# Patient Record
Sex: Female | Born: 1940 | Race: White | Hispanic: No | Smoking: Former smoker
Health system: Southern US, Community
[De-identification: ages and names within clinical notes are randomized; demographics above are authoritative.]

## PROBLEM LIST (undated history)

## (undated) DIAGNOSIS — E440 Moderate protein-calorie malnutrition: Secondary | ICD-10-CM

## (undated) DIAGNOSIS — J449 Chronic obstructive pulmonary disease, unspecified: Secondary | ICD-10-CM

## (undated) DIAGNOSIS — J181 Lobar pneumonia, unspecified organism: Secondary | ICD-10-CM

## (undated) DIAGNOSIS — D638 Anemia in other chronic diseases classified elsewhere: Secondary | ICD-10-CM

## (undated) DIAGNOSIS — A419 Sepsis, unspecified organism: Secondary | ICD-10-CM

## (undated) DIAGNOSIS — G620 Drug-induced polyneuropathy: Secondary | ICD-10-CM

## (undated) DIAGNOSIS — J189 Pneumonia, unspecified organism: Secondary | ICD-10-CM

## (undated) DIAGNOSIS — C801 Malignant (primary) neoplasm, unspecified: Secondary | ICD-10-CM

## (undated) DIAGNOSIS — D696 Thrombocytopenia, unspecified: Secondary | ICD-10-CM

## (undated) DIAGNOSIS — N200 Calculus of kidney: Secondary | ICD-10-CM

## (undated) DIAGNOSIS — Z5189 Encounter for other specified aftercare: Secondary | ICD-10-CM

## (undated) DIAGNOSIS — I10 Essential (primary) hypertension: Secondary | ICD-10-CM

## (undated) DIAGNOSIS — I714 Abdominal aortic aneurysm, without rupture: Secondary | ICD-10-CM

## (undated) DIAGNOSIS — D849 Immunodeficiency, unspecified: Secondary | ICD-10-CM

## (undated) DIAGNOSIS — Z8619 Personal history of other infectious and parasitic diseases: Secondary | ICD-10-CM

## (undated) DIAGNOSIS — C9200 Acute myeloblastic leukemia, not having achieved remission: Secondary | ICD-10-CM

## (undated) DIAGNOSIS — T451X5A Adverse effect of antineoplastic and immunosuppressive drugs, initial encounter: Secondary | ICD-10-CM

## (undated) DIAGNOSIS — E785 Hyperlipidemia, unspecified: Secondary | ICD-10-CM

## (undated) DIAGNOSIS — D219 Benign neoplasm of connective and other soft tissue, unspecified: Secondary | ICD-10-CM

## (undated) HISTORY — PX: BONE MARROW BIOPSY: SHX199

## (undated) HISTORY — PX: OTHER SURGICAL HISTORY: SHX169

---

## 2014-11-19 ENCOUNTER — Encounter (HOSPITAL_COMMUNITY): Payer: Self-pay | Admitting: *Deleted

## 2014-11-19 ENCOUNTER — Inpatient Hospital Stay (HOSPITAL_COMMUNITY)
Admission: EM | Admit: 2014-11-19 | Discharge: 2014-11-28 | DRG: 871 | Disposition: A | Discharge status: Home Health | Payer: Medicare Other | Attending: Internal Medicine | Admitting: Internal Medicine

## 2014-11-19 ENCOUNTER — Emergency Department (HOSPITAL_COMMUNITY): Payer: Medicare Other

## 2014-11-19 DIAGNOSIS — D630 Anemia in neoplastic disease: Secondary | ICD-10-CM | POA: Diagnosis present

## 2014-11-19 DIAGNOSIS — Z87891 Personal history of nicotine dependence: Secondary | ICD-10-CM | POA: Diagnosis not present

## 2014-11-19 DIAGNOSIS — Z888 Allergy status to other drugs, medicaments and biological substances status: Secondary | ICD-10-CM

## 2014-11-19 DIAGNOSIS — Z9981 Dependence on supplemental oxygen: Secondary | ICD-10-CM

## 2014-11-19 DIAGNOSIS — R109 Unspecified abdominal pain: Secondary | ICD-10-CM | POA: Diagnosis present

## 2014-11-19 DIAGNOSIS — J9811 Atelectasis: Secondary | ICD-10-CM | POA: Diagnosis present

## 2014-11-19 DIAGNOSIS — R6520 Severe sepsis without septic shock: Secondary | ICD-10-CM | POA: Diagnosis present

## 2014-11-19 DIAGNOSIS — E440 Moderate protein-calorie malnutrition: Secondary | ICD-10-CM | POA: Diagnosis present

## 2014-11-19 DIAGNOSIS — G620 Drug-induced polyneuropathy: Secondary | ICD-10-CM | POA: Diagnosis present

## 2014-11-19 DIAGNOSIS — D259 Leiomyoma of uterus, unspecified: Secondary | ICD-10-CM | POA: Diagnosis present

## 2014-11-19 DIAGNOSIS — I714 Abdominal aortic aneurysm, without rupture, unspecified: Secondary | ICD-10-CM | POA: Diagnosis present

## 2014-11-19 DIAGNOSIS — J90 Pleural effusion, not elsewhere classified: Secondary | ICD-10-CM | POA: Diagnosis present

## 2014-11-19 DIAGNOSIS — J449 Chronic obstructive pulmonary disease, unspecified: Secondary | ICD-10-CM | POA: Diagnosis present

## 2014-11-19 DIAGNOSIS — D72829 Elevated white blood cell count, unspecified: Secondary | ICD-10-CM | POA: Diagnosis present

## 2014-11-19 DIAGNOSIS — T451X5A Adverse effect of antineoplastic and immunosuppressive drugs, initial encounter: Secondary | ICD-10-CM | POA: Diagnosis present

## 2014-11-19 DIAGNOSIS — Z5189 Encounter for other specified aftercare: Secondary | ICD-10-CM

## 2014-11-19 DIAGNOSIS — R188 Other ascites: Secondary | ICD-10-CM | POA: Diagnosis present

## 2014-11-19 DIAGNOSIS — J181 Lobar pneumonia, unspecified organism: Secondary | ICD-10-CM | POA: Diagnosis present

## 2014-11-19 DIAGNOSIS — D6481 Anemia due to antineoplastic chemotherapy: Secondary | ICD-10-CM | POA: Diagnosis present

## 2014-11-19 DIAGNOSIS — I712 Thoracic aortic aneurysm, without rupture: Secondary | ICD-10-CM | POA: Diagnosis present

## 2014-11-19 DIAGNOSIS — I10 Essential (primary) hypertension: Secondary | ICD-10-CM | POA: Diagnosis present

## 2014-11-19 DIAGNOSIS — D696 Thrombocytopenia, unspecified: Secondary | ICD-10-CM | POA: Diagnosis present

## 2014-11-19 DIAGNOSIS — Z6821 Body mass index (BMI) 21.0-21.9, adult: Secondary | ICD-10-CM

## 2014-11-19 DIAGNOSIS — Z8619 Personal history of other infectious and parasitic diseases: Secondary | ICD-10-CM

## 2014-11-19 DIAGNOSIS — A419 Sepsis, unspecified organism: Principal | ICD-10-CM | POA: Diagnosis present

## 2014-11-19 DIAGNOSIS — T458X5A Adverse effect of other primarily systemic and hematological agents, initial encounter: Secondary | ICD-10-CM | POA: Diagnosis present

## 2014-11-19 DIAGNOSIS — R197 Diarrhea, unspecified: Secondary | ICD-10-CM | POA: Diagnosis present

## 2014-11-19 DIAGNOSIS — R6521 Severe sepsis with septic shock: Secondary | ICD-10-CM

## 2014-11-19 DIAGNOSIS — N200 Calculus of kidney: Secondary | ICD-10-CM | POA: Diagnosis present

## 2014-11-19 DIAGNOSIS — T501X5A Adverse effect of loop [high-ceiling] diuretics, initial encounter: Secondary | ICD-10-CM | POA: Diagnosis not present

## 2014-11-19 DIAGNOSIS — D638 Anemia in other chronic diseases classified elsewhere: Secondary | ICD-10-CM | POA: Diagnosis present

## 2014-11-19 DIAGNOSIS — C9200 Acute myeloblastic leukemia, not having achieved remission: Secondary | ICD-10-CM | POA: Diagnosis present

## 2014-11-19 DIAGNOSIS — J189 Pneumonia, unspecified organism: Secondary | ICD-10-CM

## 2014-11-19 DIAGNOSIS — E871 Hypo-osmolality and hyponatremia: Secondary | ICD-10-CM | POA: Diagnosis present

## 2014-11-19 DIAGNOSIS — Z7952 Long term (current) use of systemic steroids: Secondary | ICD-10-CM | POA: Diagnosis not present

## 2014-11-19 DIAGNOSIS — R140 Abdominal distension (gaseous): Secondary | ICD-10-CM

## 2014-11-19 DIAGNOSIS — G9340 Encephalopathy, unspecified: Secondary | ICD-10-CM | POA: Diagnosis present

## 2014-11-19 DIAGNOSIS — D6959 Other secondary thrombocytopenia: Secondary | ICD-10-CM | POA: Diagnosis present

## 2014-11-19 DIAGNOSIS — I739 Peripheral vascular disease, unspecified: Secondary | ICD-10-CM | POA: Diagnosis present

## 2014-11-19 DIAGNOSIS — I959 Hypotension, unspecified: Secondary | ICD-10-CM | POA: Diagnosis present

## 2014-11-19 DIAGNOSIS — E876 Hypokalemia: Secondary | ICD-10-CM | POA: Diagnosis not present

## 2014-11-19 DIAGNOSIS — R161 Splenomegaly, not elsewhere classified: Secondary | ICD-10-CM | POA: Diagnosis present

## 2014-11-19 DIAGNOSIS — R112 Nausea with vomiting, unspecified: Secondary | ICD-10-CM | POA: Diagnosis present

## 2014-11-19 DIAGNOSIS — N179 Acute kidney failure, unspecified: Secondary | ICD-10-CM | POA: Diagnosis present

## 2014-11-19 DIAGNOSIS — Z79899 Other long term (current) drug therapy: Secondary | ICD-10-CM | POA: Diagnosis not present

## 2014-11-19 DIAGNOSIS — D849 Immunodeficiency, unspecified: Secondary | ICD-10-CM | POA: Diagnosis present

## 2014-11-19 DIAGNOSIS — N210 Calculus in bladder: Secondary | ICD-10-CM | POA: Diagnosis present

## 2014-11-19 DIAGNOSIS — E785 Hyperlipidemia, unspecified: Secondary | ICD-10-CM | POA: Diagnosis present

## 2014-11-19 DIAGNOSIS — R0602 Shortness of breath: Secondary | ICD-10-CM

## 2014-11-19 HISTORY — DX: Acute myeloblastic leukemia, not having achieved remission: C92.00

## 2014-11-19 HISTORY — DX: Malignant (primary) neoplasm, unspecified: C80.1

## 2014-11-19 HISTORY — DX: Calculus of kidney: N20.0

## 2014-11-19 HISTORY — DX: Hyperlipidemia, unspecified: E78.5

## 2014-11-19 HISTORY — DX: Abdominal aortic aneurysm, without rupture: I71.4

## 2014-11-19 HISTORY — DX: Anemia in other chronic diseases classified elsewhere: D63.8

## 2014-11-19 HISTORY — DX: Encounter for other specified aftercare: Z51.89

## 2014-11-19 HISTORY — DX: Drug-induced polyneuropathy: G62.0

## 2014-11-19 HISTORY — DX: Essential (primary) hypertension: I10

## 2014-11-19 HISTORY — DX: Benign neoplasm of connective and other soft tissue, unspecified: D21.9

## 2014-11-19 HISTORY — DX: Personal history of other infectious and parasitic diseases: Z86.19

## 2014-11-19 HISTORY — DX: Thrombocytopenia, unspecified: D69.6

## 2014-11-19 HISTORY — DX: Lobar pneumonia, unspecified organism: J18.1

## 2014-11-19 HISTORY — DX: Immunodeficiency, unspecified: D84.9

## 2014-11-19 HISTORY — DX: Chronic obstructive pulmonary disease, unspecified: J44.9

## 2014-11-19 HISTORY — DX: Pneumonia, unspecified organism: J18.9

## 2014-11-19 HISTORY — DX: Adverse effect of antineoplastic and immunosuppressive drugs, initial encounter: T45.1X5A

## 2014-11-19 HISTORY — DX: Sepsis, unspecified organism: A41.9

## 2014-11-19 HISTORY — DX: Moderate protein-calorie malnutrition: E44.0

## 2014-11-19 LAB — URINALYSIS, ROUTINE W REFLEX MICROSCOPIC
BILIRUBIN URINE: NEGATIVE
Glucose, UA: NEGATIVE mg/dL
Hgb urine dipstick: NEGATIVE
Ketones, ur: NEGATIVE mg/dL
Leukocytes, UA: NEGATIVE
NITRITE: NEGATIVE
Protein, ur: NEGATIVE mg/dL
SPECIFIC GRAVITY, URINE: 1.013 (ref 1.005–1.030)
UROBILINOGEN UA: 0.2 mg/dL (ref 0.0–1.0)
pH: 7 (ref 5.0–8.0)

## 2014-11-19 LAB — CBC WITH DIFFERENTIAL/PLATELET
BAND NEUTROPHILS: 7 %
BASOS ABS: 0 10*3/uL (ref 0.0–0.1)
BLASTS: 0 %
Basophils Relative: 0 %
EOS ABS: 0 10*3/uL (ref 0.0–0.7)
Eosinophils Relative: 0 %
HEMATOCRIT: 22.2 % — AB (ref 36.0–46.0)
HEMOGLOBIN: 7 g/dL — AB (ref 12.0–15.0)
Lymphocytes Relative: 14 %
Lymphs Abs: 5.7 10*3/uL — ABNORMAL HIGH (ref 0.7–4.0)
MCH: 29.5 pg (ref 26.0–34.0)
MCHC: 31.5 g/dL (ref 30.0–36.0)
MCV: 93.7 fL (ref 78.0–100.0)
METAMYELOCYTES PCT: 1 %
Monocytes Absolute: 1.2 10*3/uL — ABNORMAL HIGH (ref 0.1–1.0)
Monocytes Relative: 3 %
Myelocytes: 1 %
Neutro Abs: 33.8 10*3/uL — ABNORMAL HIGH (ref 1.7–7.7)
Neutrophils Relative %: 74 %
Other: 0 %
PROMYELOCYTES ABS: 0 %
Platelets: 61 10*3/uL — ABNORMAL LOW (ref 150–400)
RBC: 2.37 MIL/uL — ABNORMAL LOW (ref 3.87–5.11)
RDW: 18.8 % — ABNORMAL HIGH (ref 11.5–15.5)
WBC: 40.7 10*3/uL — ABNORMAL HIGH (ref 4.0–10.5)
nRBC: 0 /100 WBC

## 2014-11-19 LAB — COMPREHENSIVE METABOLIC PANEL
ALBUMIN: 2.8 g/dL — AB (ref 3.5–5.0)
ALK PHOS: 49 U/L (ref 38–126)
ALT: 19 U/L (ref 14–54)
ANION GAP: 9 (ref 5–15)
AST: 29 U/L (ref 15–41)
BILIRUBIN TOTAL: 0.8 mg/dL (ref 0.3–1.2)
BUN: 16 mg/dL (ref 6–20)
CALCIUM: 8.1 mg/dL — AB (ref 8.9–10.3)
CO2: 25 mmol/L (ref 22–32)
Chloride: 101 mmol/L (ref 101–111)
Creatinine, Ser: 1.02 mg/dL — ABNORMAL HIGH (ref 0.44–1.00)
GFR calc Af Amer: >60 mL/min (ref >60)
GFR, EST NON AFRICAN AMERICAN: 53 mL/min — AB (ref >60)
GLUCOSE: 120 mg/dL — AB (ref 65–99)
Potassium: 3.9 mmol/L (ref 3.5–5.1)
Sodium: 135 mmol/L (ref 135–145)
TOTAL PROTEIN: 4.9 g/dL — AB (ref 6.5–8.1)

## 2014-11-19 LAB — I-STAT CHEM 8, ED
BUN: 15 mg/dL (ref 6–20)
CHLORIDE: 97 mmol/L — AB (ref 101–111)
Calcium, Ion: 1.1 mmol/L — ABNORMAL LOW (ref 1.13–1.30)
Creatinine, Ser: 1.2 mg/dL — ABNORMAL HIGH (ref 0.44–1.00)
Glucose, Bld: 121 mg/dL — ABNORMAL HIGH (ref 65–99)
HEMATOCRIT: 23 % — AB (ref 36.0–46.0)
Hemoglobin: 7.8 g/dL — ABNORMAL LOW (ref 12.0–15.0)
Potassium: 4.1 mmol/L (ref 3.5–5.1)
SODIUM: 133 mmol/L — AB (ref 135–145)
TCO2: 24 mmol/L (ref 0–100)

## 2014-11-19 LAB — I-STAT CG4 LACTIC ACID, ED
LACTIC ACID, VENOUS: 2.1 mmol/L — AB (ref 0.5–2.0)
Lactic Acid, Venous: 3.34 mmol/L (ref 0.5–2.0)

## 2014-11-19 LAB — ABO/RH: ABO/RH(D): O POS

## 2014-11-19 MED ORDER — PIPERACILLIN-TAZOBACTAM 3.375 G IVPB 30 MIN
3.3750 g | Freq: Once | INTRAVENOUS | Status: AC
  Administered 2014-11-19: 3.375 g via INTRAVENOUS
  Filled 2014-11-19: qty 50

## 2014-11-19 MED ORDER — ACETAMINOPHEN 325 MG PO TABS: 650.0000 mg | ORAL_TABLET | Freq: Once | ORAL | Status: DC

## 2014-11-19 MED ORDER — SODIUM CHLORIDE 0.9 % IV BOLUS (SEPSIS)
1000.0000 mL | Freq: Once | INTRAVENOUS | Status: AC
  Administered 2014-11-19: 1000 mL via INTRAVENOUS

## 2014-11-19 MED ORDER — ONDANSETRON HCL 4 MG PO TABS: 4.0000 mg | ORAL_TABLET | Freq: Four times a day (QID) | ORAL | Status: DC | PRN

## 2014-11-19 MED ORDER — OXYCODONE HCL 5 MG PO TABS
5.0000 mg | ORAL_TABLET | ORAL | Status: DC | PRN
  Administered 2014-11-20 – 2014-11-25 (×14): 5 mg via ORAL
  Filled 2014-11-19 (×15): qty 1

## 2014-11-19 MED ORDER — METOPROLOL TARTRATE 12.5 MG HALF TABLET
12.5000 mg | ORAL_TABLET | Freq: Two times a day (BID) | ORAL | Status: DC
  Administered 2014-11-20 – 2014-11-28 (×14): 12.5 mg via ORAL
  Filled 2014-11-19 (×18): qty 1

## 2014-11-19 MED ORDER — VANCOMYCIN HCL 500 MG IV SOLR: 500.0000 mg | INTRAVENOUS | Status: DC

## 2014-11-19 MED ORDER — VANCOMYCIN 50 MG/ML ORAL SOLUTION
125.0000 mg | Freq: Four times a day (QID) | ORAL | Status: DC
  Administered 2014-11-19 – 2014-11-21 (×6): 125 mg via ORAL
  Filled 2014-11-19 (×11): qty 2.5

## 2014-11-19 MED ORDER — VANCOMYCIN HCL IN DEXTROSE 1-5 GM/200ML-% IV SOLN: 1000.0000 mg | Freq: Once | INTRAVENOUS | Status: DC

## 2014-11-19 MED ORDER — ZOLPIDEM TARTRATE 5 MG PO TABS
5.0000 mg | ORAL_TABLET | Freq: Every day | ORAL | Status: DC
Start: 2014-11-19 — End: 2014-11-23
  Administered 2014-11-19 – 2014-11-21 (×3): 5 mg via ORAL
  Filled 2014-11-19 (×3): qty 1

## 2014-11-19 MED ORDER — VANCOMYCIN HCL IN DEXTROSE 1-5 GM/200ML-% IV SOLN
1000.0000 mg | INTRAVENOUS | Status: AC
  Administered 2014-11-19: 1000 mg via INTRAVENOUS
  Filled 2014-11-19: qty 200

## 2014-11-19 MED ORDER — SODIUM CHLORIDE 0.9 % IV BOLUS (SEPSIS)
2000.0000 mL | Freq: Once | INTRAVENOUS | Status: AC
  Administered 2014-11-19: 2000 mL via INTRAVENOUS

## 2014-11-19 MED ORDER — FENTANYL CITRATE (PF) 100 MCG/2ML IJ SOLN
25.0000 ug | Freq: Once | INTRAMUSCULAR | Status: AC
  Administered 2014-11-19: 25 ug via INTRAVENOUS
  Filled 2014-11-19: qty 2

## 2014-11-19 MED ORDER — DIPHENHYDRAMINE HCL 50 MG PO CAPS: 50.0000 mg | ORAL_CAPSULE | Freq: Every day | ORAL | Status: DC

## 2014-11-19 MED ORDER — PIPERACILLIN-TAZOBACTAM 3.375 G IVPB 30 MIN: 3.3750 g | Freq: Once | INTRAVENOUS | Status: DC

## 2014-11-19 MED ORDER — PIPERACILLIN-TAZOBACTAM 3.375 G IVPB
3.3750 g | Freq: Three times a day (TID) | INTRAVENOUS | Status: DC
  Administered 2014-11-19 – 2014-11-23 (×11): 3.375 g via INTRAVENOUS
  Filled 2014-11-19 (×11): qty 50

## 2014-11-19 MED ORDER — ONDANSETRON HCL 4 MG/2ML IJ SOLN
4.0000 mg | Freq: Once | INTRAMUSCULAR | Status: AC
  Administered 2014-11-19: 4 mg via INTRAVENOUS
  Filled 2014-11-19: qty 2

## 2014-11-19 MED ORDER — FENTANYL CITRATE (PF) 100 MCG/2ML IJ SOLN
50.0000 ug | INTRAMUSCULAR | Status: DC | PRN
  Administered 2014-11-19 – 2014-11-20 (×8): 50 ug via INTRAVENOUS
  Filled 2014-11-19 (×8): qty 2

## 2014-11-19 MED ORDER — ACETAMINOPHEN 325 MG PO TABS
650.0000 mg | ORAL_TABLET | Freq: Four times a day (QID) | ORAL | Status: DC | PRN
  Administered 2014-11-27 – 2014-11-28 (×2): 650 mg via ORAL
  Filled 2014-11-19 (×2): qty 2

## 2014-11-19 MED ORDER — ATORVASTATIN CALCIUM 10 MG PO TABS
10.0000 mg | ORAL_TABLET | Freq: Every day | ORAL | Status: DC
  Administered 2014-11-19 – 2014-11-28 (×10): 10 mg via ORAL
  Filled 2014-11-19 (×10): qty 1

## 2014-11-19 MED ORDER — GABAPENTIN 400 MG PO CAPS
800.0000 mg | ORAL_CAPSULE | Freq: Three times a day (TID) | ORAL | Status: DC
  Administered 2014-11-19 – 2014-11-20 (×3): 800 mg via ORAL
  Filled 2014-11-19 (×3): qty 2

## 2014-11-19 MED ORDER — ONDANSETRON HCL 4 MG/2ML IJ SOLN
4.0000 mg | Freq: Four times a day (QID) | INTRAMUSCULAR | Status: DC | PRN
  Administered 2014-11-20 – 2014-11-25 (×5): 4 mg via INTRAVENOUS
  Filled 2014-11-19 (×5): qty 2

## 2014-11-19 MED ORDER — VANCOMYCIN HCL 500 MG IV SOLR
500.0000 mg | INTRAVENOUS | Status: DC
  Filled 2014-11-19: qty 500

## 2014-11-19 MED ORDER — NOREPINEPHRINE BITARTRATE 1 MG/ML IV SOLN: 2.0000 ug/min | INTRAVENOUS | Status: DC

## 2014-11-19 MED ORDER — SODIUM CHLORIDE 0.9 % IV SOLN
INTRAVENOUS | Status: AC
  Administered 2014-11-19: 23:00:00 via INTRAVENOUS

## 2014-11-19 MED ORDER — ACETAMINOPHEN 650 MG RE SUPP: 650.0000 mg | Freq: Four times a day (QID) | RECTAL | Status: DC | PRN

## 2014-11-19 MED ORDER — NOREPINEPHRINE BITARTRATE 1 MG/ML IV SOLN
0.0000 ug/min | INTRAVENOUS | Status: DC
  Administered 2014-11-19: 4 ug/min via INTRAVENOUS
  Filled 2014-11-19: qty 4

## 2014-11-19 NOTE — ED Notes (Signed)
Pt reports she is not in pain as long as she does not move or take a deep breath.

## 2014-11-19 NOTE — Progress Notes (Signed)
ANTIBIOTIC CONSULT NOTE - INITIAL  Pharmacy Consult for Vancomycin Indication: rule out sepsis  Allergies  Allergen Reactions  . Other     chlorhydrate     Patient Measurements: Height: 5' (152.4 cm) Weight: 106 lb (48.081 kg) IBW/kg (Calculated) : 45.5  Vital Signs: Temp: 100.2 F (37.9 C) (10/09 1500) Temp Source: Oral (10/09 1500) BP: 87/36 mmHg (10/09 1500) Pulse Rate: 78 (10/09 1500) Intake/Output from previous day:   Intake/Output from this shift: Total I/O In: 2050 [IV Piggyback:2050] Out: -   Labs:  Recent Labs  11/19/14 1459  HGB 7.8*  CREATININE 1.20*   Estimated Creatinine Clearance: 30 mL/min (by C-G formula based on Cr of 1.2). No results for input(s): VANCOTROUGH, VANCOPEAK, VANCORANDOM, GENTTROUGH, GENTPEAK, GENTRANDOM, TOBRATROUGH, TOBRAPEAK, TOBRARND, AMIKACINPEAK, AMIKACINTROU, AMIKACIN in the last 72 hours.   Microbiology: No results found for this or any previous visit (from the past 720 hour(s)).  Medical History: Past Medical History  Diagnosis Date  . Cancer ([place])   . Blood transfusion without reported diagnosis   . COPD (chronic obstructive pulmonary disease) ([place])   . Hypertension   . Immune deficiency disorder [facility])    Assessment: 48 yoF on chemotherapy was brought to ED on 10/9 with suspected sepsis and fever.  Zosyn x1 per MD, and Pharmacy is consulted to dose vancomycin.  10/9 >> Zosyn x1 10/9 >> Vanc >>   Today, 11/19/2014:  Lactic acid 3.34  Tm 101.9  WBC 40.7  SCr 1.2 with CrCl ~ 30 ml/min   Goal of Therapy:  Vancomycin trough level 15-20 mcg/ml  Plan:   Vancomycin 1g IV stat, then 500mg  IV q24h.  Measure Vanc trough at steady state.  Follow up renal fxn, culture results, and clinical course.  Follow up Zosyn dosing.  Gretta Arab PharmD, BCPS Pager [phone] 11/19/2014 3:21 PM

## 2014-11-19 NOTE — ED Notes (Signed)
Fever

## 2014-11-19 NOTE — ED Notes (Signed)
Patient transported to CT 

## 2014-11-19 NOTE — H&P (Addendum)
[facility] History and Physical  Allison Burgess BWI:[id number] DOB: Jan 30, 1941 DOA: 11/19/2014  Referring physician: Dr. Kendell Bane. PCP: No primary care provider on file. patient's primary care in [facility]. Specialists: [employer].  Chief Complaint: Nausea vomiting diarrhea and confusion.  HPI: Allison Burgess is a 74 y.o. 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 As per the patient she had multiple episodes of nausea and vomiting and diarrhea last night. Patient was found to be confused today and was brought to the ER. Patient's family also stated patient was febrile in her house with temperatures running around 101F. Patient also has been having left flank pain and on exam patient had mild tenderness of the flank. Patient was recently admitted and discharged at [facility] 2 weeks ago for C. difficile colitis. Patient had just finished a course of Flagyl week ago. Patient had chemotherapy last week followed by Neulasta injection. In the ER patient was found to be initially hypotensive and was given fluid boluses and briefly was placed on Levophed. After patient's blood pressure improved patient was weaned off Levophed. Presently patient's blood pressure is in the 638 systolic and heart rate is improved. Patient still has mild left flank pain. CT abdomen and pelvis done for renal stone study shows a renal stone nonobstructing and also a stone in the bladder. Otherwise nothing acute except for patient's known splenomegaly. Chest x-ray shows possible atelectasis versus infiltrates. Patient states she has been having cough for last 2 weeks since he stopped smoking. Patient otherwise denies any shortness of breath headache visual symptoms any weakness of the upper or lower extremities. Patient has been admitted for severe sepsis possibly from recurrence of C.  difficile colitis and possible pneumonia.   Review of Systems: As presented in the history of presenting illness, rest negative.  Past Medical History  Diagnosis Date  . Cancer ([place])   . Blood transfusion without reported diagnosis   . COPD (chronic obstructive pulmonary disease) ([place])   . Hypertension   . Immune deficiency disorder [facility])    Past Surgical History  Procedure Laterality Date  . Portacath    . Bone marrow biopsy    . Pad stent     Social History:  reports that she quit smoking about 2 weeks ago. She does not have any smokeless tobacco history on file. She reports that she does not drink alcohol or use illicit drugs. Where does patient live home. Can patient participate in ADLs? Yes.  Allergies  Allergen Reactions  . Other     chlorhydrate - makes her hyper      Family History:  Family History  Problem Relation Age of Onset  . Leukemia Neg Hx   . CAD Neg Hx   . Diabetes Mellitus II Neg Hx       Prior to Admission medications   Medication Sig Start Date End Date Taking? Authorizing Provider  amLODipine (NORVASC) 10 MG tablet Take 10 mg by mouth daily.   Yes Historical Provider, MD  atorvastatin (LIPITOR) 10 MG tablet Take 10 mg by mouth daily.   Yes Historical Provider, MD  dexamethasone (DECADRON) 4 MG tablet Take 4 mg by mouth daily. For 1 week following chemo therapy   Yes Historical Provider, MD  diphenhydrAMINE (BENADRYL) 25 mg capsule Take 50 mg by mouth at bedtime.   Yes Historical Provider, MD  gabapentin (NEURONTIN)  800 MG tablet Take 800 mg by mouth 3 (three) times daily.   Yes Historical Provider, MD  hydrocortisone cream 0.5 % Apply 1 application topically 2 (two) times daily as needed (for bruising).   Yes Historical Provider, MD  levofloxacin (LEVAQUIN) 500 MG tablet Take 500 mg by mouth daily. For 10 days 10/31/14  Yes Historical Provider, MD  metoprolol tartrate (LOPRESSOR) 25 MG tablet Take 12.5 mg by mouth 2 (two) times daily.   Yes  Historical Provider, MD  metroNIDAZOLE (FLAGYL) 500 MG tablet Take 500 mg by mouth 3 (three) times daily. For 14 days 11/06/14  Yes Historical Provider, MD  oxycodone (OXY-IR) 5 MG capsule Take 5 mg by mouth every 4 (four) hours as needed for pain.   Yes Historical Provider, MD  OXYGEN Inhale 2 L into the lungs at bedtime.   Yes Historical Provider, MD  PRESCRIPTION MEDICATION [facility] [phone] Dr. Marval Regal Historical Provider, MD    Physical Exam: Filed Vitals:   11/19/14 2000 11/19/14 2015 11/19/14 2021 11/19/14 2113  BP: 113/45 121/46 120/60 120/55  Pulse: 89 89    Temp:      TempSrc:      Resp: 21 23    Height:      Weight:      SpO2: 97% 95%       General:  Moderately built and nourished.  Eyes: Anicteric mild pallor.  ENT: No discharge from the ears eyes nose and mouth.  Neck: No mass felt. No neck rigidity.  Cardiovascular: S1 and S2 heard.  Respiratory: No rhonchi or crepitations.  Abdomen: Left upper quadrant tenderness.  Skin: No rash.  Musculoskeletal: No edema.  Psychiatric: Appears normal.  Neurologic: Alert awake oriented to time place and person. Moves all extremities.  Labs on Admission:  Basic Metabolic Panel:  Recent Labs Lab 11/19/14 1425 11/19/14 1459  NA 135 133*  K 3.9 4.1  CL 101 97*  CO2 25  --   GLUCOSE 120* 121*  BUN 16 15  CREATININE 1.02* 1.20*  CALCIUM 8.1*  --    Liver Function Tests:  Recent Labs Lab 11/19/14 1425  AST 29  ALT 19  ALKPHOS 49  BILITOT 0.8  PROT 4.9*  ALBUMIN 2.8*   No results for input(s): LIPASE, AMYLASE in the last 168 hours. No results for input(s): AMMONIA in the last 168 hours. CBC:  Recent Labs Lab 11/19/14 1425 11/19/14 1459  WBC 40.7*  --   NEUTROABS 33.8*  --   HGB 7.0* 7.8*  HCT 22.2* 23.0*  MCV 93.7  --   PLT 61*  --    Cardiac Enzymes: No results for input(s): CKTOTAL, CKMB, CKMBINDEX, TROPONINI in the last 168  hours.  BNP (last 3 results) No results for input(s): BNP in the last 8760 hours.  ProBNP (last 3 results) No results for input(s): PROBNP in the last 8760 hours.  CBG: No results for input(s): GLUCAP in the last 168 hours.  Radiological Exams on Admission: Dg Chest Port 1 View  11/19/2014   CLINICAL DATA:  Cough and chest congestion over the last few days.  EXAM: PORTABLE CHEST 1 VIEW  COMPARISON:  None.  FINDINGS: Power port inserted from a right internal jugular approach has its tip at the SVC RA junction. The patient is rotated towards the left. Heart size is normal. The aorta shows calcification. Right lung is clear. The left lung is clear except for mild  atelectasis or infiltrate at the base. No dense consolidation. No lobar collapse. No effusion.  IMPRESSION: Rotated towards the left. Mild patchy density at the left base that could represent atelectasis or mild left base pneumonia.   Electronically Signed   By: Nelson Chimes M.D.   On: 11/19/2014 15:43   Ct Renal Stone Study  11/19/2014   CLINICAL DATA:  74 year old who is currently undergoing chemotherapy for leukemia, presenting to the emergency department with fever, generalized abdominal pain and hypotension, possible sepsis.  EXAM: CT ABDOMEN AND PELVIS WITHOUT CONTRAST  TECHNIQUE: Multidetector CT imaging of the abdomen and pelvis was performed following the standard protocol without IV contrast.  COMPARISON:  None.  FINDINGS: Respiratory motion blurred many of the images of the upper and mid abdomen.  Hepatobiliary: Liver normal in size and appearance. No calcified gallstones. No gallbladder wall thickening or pericholecystic inflammation. No biliary ductal dilation.  Spleen: Enlarged measuring approximately 12.0 x 7.3 x 13.4 cm yielding a volume of approximately 600 ml. No focal splenic parenchymal abnormality allowing for the unenhanced technique.  Pancreas:  Normal unenhanced appearance.  Adrenal glands:  Normal in appearance.   Genitourinary: Approximate 1.5 cm nonobstructing calculus in a lower pole calix of the right kidney. Very small (2-3 mm) calculi in lower pole calices of the left kidney. No evidence of hydronephrosis involving either kidney. Within the limits of the unenhanced technique, no focal parenchymal abnormality involving either kidney other than an exophytic simple cyst arising from the lower pole of the left kidney. Approximate 8 mm calculus in the dependent portion of the urinary bladder. Distended urinary bladder.  Multiple uterine fibroids, including a calcified, degenerated fibroid arising from the fundus approximate 3.1 x 3.3 cm cystic structure low in the left side of the pelvis which appears to arise from the lower uterine segment.  Gastrointestinal: Stomach normal in appearance for degree of distention. Normal-appearing small bowel. Moderate colonic stool burden. No evidence of diverticulosis. Normal appendix in the right upper pelvis.  Ascites: Small amount of ascites in the perihepatic and perisplenic regions. No significant ascites elsewhere.  Vascular: Severe aortoiliofemoral atherosclerosis. 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 Bilobed infrarenal abdominal aortic aneurysm with the upper and lower portions each having a maximum diameter of 3.0 cm. Bilateral common iliac artery stents.  Lymphatic:  No pathologic lymphadenopathy in the abdomen or pelvis.  Other findings: None.  Musculoskeletal: Thoracolumbar scoliosis convex left. Severe symmetric joint space narrowing involving both hips. Severe facet degenerative changes at L4-5 and L5-S1, left much greater than right. Osseous demineralization.  Visualized lower thorax: Heart enlarged with left ventricular predominance. Apparent right coronary artery stenting. Scarring in the lingula and left lower lobe.  IMPRESSION: 1. Small amount of ascites in the upper abdomen. 2. No acute abnormalities otherwise involving the  abdomen or pelvis. 3. 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 Bilobed infrarenal abdominal aortic aneurysm with maximum diameter 3.0 cm. 4. Nonobstructing bilateral renal calculi including a large (1.5 cm) calculus in the lower pole of the right kidney. 5. Approximate 8 mm calculus in the dependent portion of the distended urinary bladder. 6. Splenomegaly without evidence of focal splenic parenchymal abnormality. 7. Multiple uterine fibroids. What I believe is a degenerated, liquified approximate 3 cm fibroid arises from the left posterior lower uterine segment.   Electronically Signed   By: Evangeline Dakin M.D.   On: 11/19/2014 17:25    Assessment/Plan Principal Problem:   Sepsis ([place]) Active Problems:  Nausea vomiting and diarrhea   Acute encephalopathy   Essential hypertension   AML (acute myelogenous leukemia) ([facility])   PAD (peripheral artery disease) ([facility])   Hyperlipidemia   Left flank pain   1. Severe sepsis - suspect recurrence of C. difficile colitis as a primary cause but since chest x-ray showing possible infiltrate and patient also states she has been having some productive cough we will continue with vancomycin and Zosyn and by mouth vancomycin per pharmacy for C. difficile coverage. Follow blood cultures urine cultures and C. difficile studies. Continue with hydration and hold antihypertensives. Check lactic acid levels and procalcitonin. Patient is on sepsis protocol. As discussed in the history of present illness patient was briefly on liver for which has been discontinued now. 2. Left flank pain - patient still has mild tenderness. Patient's CT renal survey shows splenomegaly and also stone in the bladder. Otherwise nothing acute. If abdominal pain still persists may need CT abdomen with contrast. 3. Acute encephalopathy - probably from sepsis. Presently essentially resolved. Closely observe. Appears nonfocal. 4. Acute renal failure - probably from  dehydration continue hydration and closely following the verbal metabolic panel. 5. Anemia and thrombocytopenia with leukocytosis - probably related to patient's known history of AML and recent Neulasta injection. Leukocytosis also could be from patient's sepsis. Follow CBC. Type and screen. 6. Hypertension - hold antihypertensives for now. 7. Hyperlipidemia - on statins. 8. History of peripheral artery disease status post stenting. 9. 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 Patient will need close follow-up as outpatient. If Abdominal pain persists then CT contrast as discussed #2 should be done.  I have reviewed patient's old charts are labs in care everywhere. Personally reviewed chest x-ray.   DVT Prophylaxis SCDs. Patient has significant thrombocytopenia.  Code Status: Full code.  Family Communication: Patient's husband and son.  Disposition Plan: Admit to inpatient.    Rilya Longo N. [facility] Pager [phone].  If 7PM-7AM, please contact night-coverage [email or web address] Password [email or web address] 11/19/2014, 11:20 PM

## 2014-11-19 NOTE — ED Notes (Signed)
Bed: YO37 Expected date: 11/19/14 Expected time: 1:50 PM Means of arrival: Ambulance Comments: ? Sepsis, fever chemo pt

## 2014-11-19 NOTE — ED Notes (Signed)
She is back from CT and remains in no distress.

## 2014-11-19 NOTE — ED Provider Notes (Signed)
CSN: [id number]     Arrival date & time 11/19/14  1400 History   First MD Initiated Contact with Patient 11/19/14 1416     Chief Complaint  Patient presents with  . Code Sepsis     (Consider location/radiation/quality/duration/timing/severity/associated sxs/prior Treatment) Patient is a 74 y.o. female presenting with general illness. The history is provided by the patient and a relative.  Illness Severity:  Severe Onset quality:  Gradual Duration:  1 day Timing:  Constant Progression:  Worsening Chronicity:  Recurrent Associated symptoms: abdominal pain, congestion, cough and fever   Associated symptoms: no chest pain, no headaches, no myalgias, no nausea, no rhinorrhea, no shortness of breath, no vomiting and no wheezing     74 yo F with a chief complaint of abdominal pain. Patient has had vomiting and diarrhea since yesterday. Patient is on chemotherapy for AML. Patient is typically seen at [facility] for this care. Family had moved here about a year ago and the patient is closer to this hospital. Was taken here when she was having significant pain as well as more tiredness for them at home. Family feels like she has been neutropenic before. Last had an injection on Friday to increase their white blood cells. Was recently hospitalized by regional for hospital-acquired pneumonia. While there patient stay was complicated with a Clostridium difficile infection.  Past Medical History  Diagnosis Date  . Cancer ([place])   . Blood transfusion without reported diagnosis   . COPD (chronic obstructive pulmonary disease) ([place])   . Hypertension   . Immune deficiency disorder [facility])    Past Surgical History  Procedure Laterality Date  . Portacath    . Bone marrow biopsy    . Pad stent     Family History  Problem Relation Age of Onset  . Leukemia Neg Hx   . CAD Neg Hx   . Diabetes Mellitus II Neg Hx    Social History  Substance Use Topics  . Smoking status: Former Smoker     Quit date: 11/05/2014  . Smokeless tobacco: None  . Alcohol Use: No   OB History    No data available     Review of Systems  Constitutional: Positive for fever. Negative for chills.  HENT: Positive for congestion. Negative for rhinorrhea.   Eyes: Negative for redness and visual disturbance.  Respiratory: Positive for cough. Negative for shortness of breath and wheezing.   Cardiovascular: Negative for chest pain and palpitations.  Gastrointestinal: Positive for abdominal pain. Negative for nausea and vomiting.  Genitourinary: Negative for dysuria and urgency.  Musculoskeletal: Negative for myalgias and arthralgias.  Skin: Negative for pallor and wound.  Neurological: Negative for dizziness and headaches.      Allergies  Other  Home Medications   Prior to Admission medications   Medication Sig Start Date End Date Taking? Authorizing Provider  amLODipine (NORVASC) 10 MG tablet Take 10 mg by mouth daily.   Yes Historical Provider, MD  atorvastatin (LIPITOR) 10 MG tablet Take 10 mg by mouth daily.   Yes Historical Provider, MD  dexamethasone (DECADRON) 4 MG tablet Take 4 mg by mouth daily. For 1 week following chemo therapy   Yes Historical Provider, MD  diphenhydrAMINE (BENADRYL) 25 mg capsule Take 50 mg by mouth at bedtime.   Yes Historical Provider, MD  gabapentin (NEURONTIN) 800 MG tablet Take 800 mg by mouth 3 (three) times daily.   Yes Historical Provider, MD  hydrocortisone cream 0.5 % Apply 1 application topically 2 (  two) times daily as needed (for bruising).   Yes Historical Provider, MD  levofloxacin (LEVAQUIN) 500 MG tablet Take 500 mg by mouth daily. For 10 days 10/31/14  Yes Historical Provider, MD  metoprolol tartrate (LOPRESSOR) 25 MG tablet Take 12.5 mg by mouth 2 (two) times daily.   Yes Historical Provider, MD  metroNIDAZOLE (FLAGYL) 500 MG tablet Take 500 mg by mouth 3 (three) times daily. For 14 days 11/06/14  Yes Historical Provider, MD  oxycodone (OXY-IR) 5 MG  capsule Take 5 mg by mouth every 4 (four) hours as needed for pain.   Yes Historical Provider, MD  OXYGEN Inhale 2 L into the lungs at bedtime.   Yes Historical Provider, MD  PRESCRIPTION MEDICATION Chemo - [facility] [phone] Dr. Baird Cancer   Yes Historical Provider, MD   BP 119/44 mmHg  Pulse 83  Temp(Src) 98.6 F (37 C) (Oral)  Resp 24  Ht 5' (1.524 m)  Wt 116 lb 6.5 oz (52.8 kg)  BMI 22.73 kg/m2  SpO2 96% Physical Exam  Constitutional: She is oriented to person, place, and time. She appears well-developed and well-nourished. No distress.  HENT:  Head: Normocephalic and atraumatic.  Eyes: EOM are normal. Pupils are equal, round, and reactive to light.  Neck: Normal range of motion. Neck supple.  Cardiovascular: Normal rate and regular rhythm.  Exam reveals no gallop and no friction rub.   No murmur heard. Pulmonary/Chest: Effort normal. She has no wheezes. She has no rales.  Abdominal: Soft. She exhibits no distension. There is tenderness (left cva worst). There is no rebound and no guarding.  Musculoskeletal: She exhibits no edema or tenderness.  Neurological: She is alert and oriented to person, place, and time.  Skin: Skin is warm and dry. She is not diaphoretic.  Psychiatric: She has a normal mood and affect. Her behavior is normal.    ED Course  Procedures (including critical care time) Labs Review Labs Reviewed  COMPREHENSIVE METABOLIC PANEL - Abnormal; Notable for the following:    Glucose, Bld 120 (*)    Creatinine, Ser 1.02 (*)    Calcium 8.1 (*)    Total Protein 4.9 (*)    Albumin 2.8 (*)    GFR calc non Af Amer 53 (*)    All other components within normal limits  CBC WITH DIFFERENTIAL/PLATELET - Abnormal; Notable for the following:    WBC 40.7 (*)    RBC 2.37 (*)    Hemoglobin 7.0 (*)    HCT 22.2 (*)    RDW 18.8 (*)    Platelets 61 (*)    Neutro Abs 33.8 (*)    Lymphs Abs 5.7 (*)    Monocytes Absolute 1.2 (*)     All other components within normal limits  URINALYSIS, ROUTINE W REFLEX MICROSCOPIC (NOT AT [facility]) - Abnormal; Notable for the following:    APPearance CLOUDY (*)    All other components within normal limits  CBC WITH DIFFERENTIAL/PLATELET - Abnormal; Notable for the following:    WBC 33.4 (*)    RBC 2.56 (*)    Hemoglobin 7.4 (*)    HCT 24.4 (*)    RDW 19.1 (*)    Platelets 48 (*)    Neutro Abs 28.7 (*)    Monocytes Absolute 2.0 (*)    All other components within normal limits  PROTIME-INR - Abnormal; Notable for the following:    Prothrombin Time 20.1 (*)    INR 1.71 (*)  All other components within normal limits  APTT - Abnormal; Notable for the following:    aPTT 39 (*)    All other components within normal limits  COMPREHENSIVE METABOLIC PANEL - Abnormal; Notable for the following:    Glucose, Bld 103 (*)    Calcium 7.3 (*)    Total Protein 4.9 (*)    Albumin 2.8 (*)    All other components within normal limits  I-STAT CG4 LACTIC ACID, ED - Abnormal; Notable for the following:    Lactic Acid, Venous 3.34 (*)    All other components within normal limits  I-STAT CHEM 8, ED - Abnormal; Notable for the following:    Sodium 133 (*)    Chloride 97 (*)    Creatinine, Ser 1.20 (*)    Glucose, Bld 121 (*)    Calcium, Ion 1.10 (*)    Hemoglobin 7.8 (*)    HCT 23.0 (*)    All other components within normal limits  I-STAT CG4 LACTIC ACID, ED - Abnormal; Notable for the following:    Lactic Acid, Venous 2.10 (*)    All other components within normal limits  CULTURE, BLOOD (ROUTINE X 2)  CULTURE, BLOOD (ROUTINE X 2)  URINE CULTURE  MRSA PCR SCREENING  C DIFFICILE QUICK SCREEN W PCR REFLEX  LACTIC ACID, PLASMA  LACTIC ACID, PLASMA  PROCALCITONIN  GI PATHOGEN PANEL BY PCR, STOOL  TYPE AND SCREEN  ABO/RH    Imaging Review Dg Chest Port 1 View  11/19/2014   CLINICAL DATA:  Cough and chest congestion over the last few days.  EXAM: PORTABLE CHEST 1 VIEW  COMPARISON:   None.  FINDINGS: Power port inserted from a right internal jugular approach has its tip at the SVC RA junction. The patient is rotated towards the left. Heart size is normal. The aorta shows calcification. Right lung is clear. The left lung is clear except for mild atelectasis or infiltrate at the base. No dense consolidation. No lobar collapse. No effusion.  IMPRESSION: Rotated towards the left. Mild patchy density at the left base that could represent atelectasis or mild left base pneumonia.   Electronically Signed   By: Nelson Chimes M.D.   On: 11/19/2014 15:43   Ct Renal Stone Study  11/19/2014   CLINICAL DATA:  74 year old who is currently undergoing chemotherapy for leukemia, presenting to the emergency department with fever, generalized abdominal pain and hypotension, possible sepsis.  EXAM: CT ABDOMEN AND PELVIS WITHOUT CONTRAST  TECHNIQUE: Multidetector CT imaging of the abdomen and pelvis was performed following the standard protocol without IV contrast.  COMPARISON:  None.  FINDINGS: Respiratory motion blurred many of the images of the upper and mid abdomen.  Hepatobiliary: Liver normal in size and appearance. No calcified gallstones. No gallbladder wall thickening or pericholecystic inflammation. No biliary ductal dilation.  Spleen: Enlarged measuring approximately 12.0 x 7.3 x 13.4 cm yielding a volume of approximately 600 ml. No focal splenic parenchymal abnormality allowing for the unenhanced technique.  Pancreas:  Normal unenhanced appearance.  Adrenal glands:  Normal in appearance.  Genitourinary: Approximate 1.5 cm nonobstructing calculus in a lower pole calix of the right kidney. Very small (2-3 mm) calculi in lower pole calices of the left kidney. No evidence of hydronephrosis involving either kidney. Within the limits of the unenhanced technique, no focal parenchymal abnormality involving either kidney other than an exophytic simple cyst arising from the lower pole of the left kidney.  Approximate 8 mm calculus in the dependent portion  of the urinary bladder. Distended urinary bladder.  Multiple uterine fibroids, including a calcified, degenerated fibroid arising from the fundus approximate 3.1 x 3.3 cm cystic structure low in the left side of the pelvis which appears to arise from the lower uterine segment.  Gastrointestinal: Stomach normal in appearance for degree of distention. Normal-appearing small bowel. Moderate colonic stool burden. No evidence of diverticulosis. Normal appendix in the right upper pelvis.  Ascites: Small amount of ascites in the perihepatic and perisplenic regions. No significant ascites elsewhere.  Vascular: Severe aortoiliofemoral atherosclerosis. 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 Bilobed infrarenal abdominal aortic aneurysm with the upper and lower portions each having a maximum diameter of 3.0 cm. Bilateral common iliac artery stents.  Lymphatic:  No pathologic lymphadenopathy in the abdomen or pelvis.  Other findings: None.  Musculoskeletal: Thoracolumbar scoliosis convex left. Severe symmetric joint space narrowing involving both hips. Severe facet degenerative changes at L4-5 and L5-S1, left much greater than right. Osseous demineralization.  Visualized lower thorax: Heart enlarged with left ventricular predominance. Apparent right coronary artery stenting. Scarring in the lingula and left lower lobe.  IMPRESSION: 1. Small amount of ascites in the upper abdomen. 2. No acute abnormalities otherwise involving the abdomen or pelvis. 3. 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 Bilobed infrarenal abdominal aortic aneurysm with maximum diameter 3.0 cm. 4. Nonobstructing bilateral renal calculi including a large (1.5 cm) calculus in the lower pole of the right kidney. 5. Approximate 8 mm calculus in the dependent portion of the distended urinary bladder. 6. Splenomegaly without evidence of focal  splenic parenchymal abnormality. 7. Multiple uterine fibroids. What I believe is a degenerated, liquified approximate 3 cm fibroid arises from the left posterior lower uterine segment.   Electronically Signed   By: Evangeline Dakin M.D.   On: 11/19/2014 17:25   I have personally reviewed and evaluated these images and lab results as part of my medical decision-making.   EKG Interpretation   Date/Time:  Sunday November 19 2014 14:14:54 EDT Ventricular Rate:  83 PR Interval:  107 QRS Duration: 88 QT Interval:  355 QTC Calculation: 417 R Axis:   -9 Text Interpretation:  Sinus rhythm Short PR interval Nonspecific T  abnormalities, lateral leads flipped t wave in lead aVL No old tracing to  compare Confirmed by Savon Bordonaro MD, DANIEL [phone]) on 11/19/2014 2:37:23 PM        EMERGENCY DEPARTMENT [place] CARDIAC EXAM "Study: Limited Ultrasound of the heart and pericardium"  INDICATIONS:Hypotension Multiple views of the heart and pericardium are obtained with a multi-frequency probe.  PERFORMED UJ:[id number]  IMAGES ARCHIVED?: Yes  FINDINGS: No pericardial effusion, Decreased contractility and IVC dilated  LIMITATIONS:  Emergent procedure  VIEWS USED: Subcostal 4 chamber, Apical 4 chamber  and Inferior Vena Cava  INTERPRETATION: Cardiac activity present, Pericardial effusioin absent, Cardiac tamponade absent, Probable elevated CVP and Decreased contractility   COMMENT:  Dilated IVC, need to start pressors  MDM   Final diagnoses:  Left flank pain  Sepsis, due to unspecified organism [facility])  Septic shock ([place])    74 yo F with a chief complaint of fever. Patient has a history of neutropenia. Febrile to 1.9 orally here. Blood pressure is low 90 over 30s with worst in the 78G systolic. Patient was given 2 L IV fluids. On metoprolol may blunt tachycardia.  Code sepsis initiated.  Bedside ultrasound with very minimal respiratory variation in the IVC. Started on [place].  Initial lactate 3.3. Hemoglobin  7.8.  Will obtain CT abd with left flank pain and recent hx of cdiff. Patient care turned over to Dr. Billy Fischer  CRITICAL CARE Performed by: Cecilio Asper   Total critical care time: 45 min  Critical care time was exclusive of separately billable procedures and treating other patients.  Critical care was necessary to treat or prevent imminent or life-threatening deterioration.  Critical care was time spent personally by me on the following activities: development of treatment plan with patient and/or surrogate as well as nursing, discussions with consultants, evaluation of patient's response to treatment, examination of patient, obtaining history from patient or surrogate, ordering and performing treatments and interventions, ordering and review of laboratory studies, ordering and review of radiographic studies, pulse oximetry and re-evaluation of patient's condition.  The patients results and plan were reviewed and discussed.   Any x-rays performed were independently reviewed by myself.   Differential diagnosis were considered with the presenting HPI.  Medications  vancomycin (VANCOCIN) 50 mg/mL oral solution 125 mg (125 mg Oral Given 11/20/14 1146)  fentaNYL (SUBLIMAZE) injection 50 mcg (50 mcg Intravenous Given 11/20/14 1146)  atorvastatin (LIPITOR) tablet 10 mg (10 mg Oral Given 11/20/14 0931)  gabapentin (NEURONTIN) capsule 800 mg (800 mg Oral Given 11/20/14 0931)  metoprolol tartrate (LOPRESSOR) tablet 12.5 mg (12.5 mg Oral Given 11/20/14 0931)  oxyCODONE (Oxy IR/ROXICODONE) immediate release tablet 5 mg (5 mg Oral Given 11/20/14 1340)  acetaminophen (TYLENOL) tablet 650 mg (not administered)    Or  acetaminophen (TYLENOL) suppository 650 mg (not administered)  ondansetron (ZOFRAN) tablet 4 mg ( Oral See Alternative 11/20/14 1007)    Or  ondansetron (ZOFRAN) injection 4 mg (4 mg Intravenous Given 11/20/14 1007)  0.9 %  sodium chloride infusion ( Intravenous Rate/Dose  Change 11/20/14 1431)  zolpidem (AMBIEN) tablet 5 mg (5 mg Oral Given 11/19/14 2349)  piperacillin-tazobactam (ZOSYN) IVPB 3.375 g (3.375 g Intravenous Given 11/20/14 0744)  antiseptic oral rinse (CPC / CETYLPYRIDINIUM CHLORIDE 0.05%) solution 7 mL (7 mLs Mouth Rinse Given 11/20/14 0932)  vancomycin (VANCOCIN) 500 mg in sodium chloride 0.9 % 100 mL IVPB (500 mg Intravenous Given 11/20/14 0802)  potassium chloride SA (K-DUR,KLOR-CON) CR tablet 40 mEq (not administered)  sodium chloride 0.9 % bolus 2,000 mL (0 mLs Intravenous Stopped 11/19/14 1559)  piperacillin-tazobactam (ZOSYN) IVPB 3.375 g (0 g Intravenous Stopped 11/19/14 1559)  vancomycin (VANCOCIN) IVPB 1000 mg/200 mL premix (0 mg Intravenous Stopped 11/19/14 1702)  sodium chloride 0.9 % bolus 1,000 mL (0 mLs Intravenous Stopped 11/19/14 1845)  sodium chloride 0.9 % bolus 1,000 mL (0 mLs Intravenous Stopped 11/19/14 2115)  sodium chloride 0.9 % bolus 1,000 mL (0 mLs Intravenous Stopped 11/19/14 1947)  fentaNYL (SUBLIMAZE) injection 25 mcg (25 mcg Intravenous Given 11/19/14 2113)  ondansetron (ZOFRAN) injection 4 mg (4 mg Intravenous Given 11/19/14 2113)  iohexol (OMNIPAQUE) 300 MG/ML solution 25 mL (25 mLs Oral Contrast Given 11/20/14 1145)  iohexol (OMNIPAQUE) 300 MG/ML solution 100 mL (100 mLs Intravenous Contrast Given 11/20/14 1516)    Filed Vitals:   11/20/14 1000 11/20/14 1100 11/20/14 1200 11/20/14 1300  BP: 139/49 102/49 115/44 119/44  Pulse: 87 74 82 83  Temp:   98.6 F (37 C)   TempSrc:   Oral   Resp: 23 23 23 24   Height:      Weight:      SpO2: 98% 98% 96% 96%    Final diagnoses:  Left flank pain  Sepsis, due to unspecified organism ([place])  Septic shock (  [place])    Admission/ observation were discussed with the admitting physician, patient and/or family and they are comfortable with the plan.     Deno Etienne, DO 11/20/14 1520

## 2014-11-19 NOTE — ED Notes (Signed)
I have just received pt. And find her to be drowsy and in no distress.  Monitor shows nsr.  I have just added Vancomycin (via left a/c) and initiated norepinephrine drip (right forearm IV).  She has no requests at this time and her family (a young couple) are with her.

## 2014-11-20 ENCOUNTER — Inpatient Hospital Stay (HOSPITAL_COMMUNITY): Payer: Medicare Other

## 2014-11-20 DIAGNOSIS — I739 Peripheral vascular disease, unspecified: Secondary | ICD-10-CM

## 2014-11-20 DIAGNOSIS — I10 Essential (primary) hypertension: Secondary | ICD-10-CM

## 2014-11-20 DIAGNOSIS — E785 Hyperlipidemia, unspecified: Secondary | ICD-10-CM

## 2014-11-20 LAB — COMPREHENSIVE METABOLIC PANEL
ALBUMIN: 2.8 g/dL — AB (ref 3.5–5.0)
ALT: 18 U/L (ref 14–54)
AST: 24 U/L (ref 15–41)
Alkaline Phosphatase: 47 U/L (ref 38–126)
Anion gap: 7 (ref 5–15)
BUN: 9 mg/dL (ref 6–20)
CHLORIDE: 111 mmol/L (ref 101–111)
CO2: 22 mmol/L (ref 22–32)
Calcium: 7.3 mg/dL — ABNORMAL LOW (ref 8.9–10.3)
Creatinine, Ser: 0.8 mg/dL (ref 0.44–1.00)
GFR calc Af Amer: >60 mL/min (ref >60)
GLUCOSE: 103 mg/dL — AB (ref 65–99)
POTASSIUM: 3.6 mmol/L (ref 3.5–5.1)
Sodium: 140 mmol/L (ref 135–145)
Total Bilirubin: 0.7 mg/dL (ref 0.3–1.2)
Total Protein: 4.9 g/dL — ABNORMAL LOW (ref 6.5–8.1)

## 2014-11-20 LAB — CBC WITH DIFFERENTIAL/PLATELET
Basophils Absolute: 0 10*3/uL (ref 0.0–0.1)
Basophils Relative: 0 %
EOS PCT: 0 %
Eosinophils Absolute: 0 10*3/uL (ref 0.0–0.7)
HCT: 24.4 % — ABNORMAL LOW (ref 36.0–46.0)
HEMOGLOBIN: 7.4 g/dL — AB (ref 12.0–15.0)
LYMPHS ABS: 2.7 10*3/uL (ref 0.7–4.0)
Lymphocytes Relative: 8 %
MCH: 28.9 pg (ref 26.0–34.0)
MCHC: 30.3 g/dL (ref 30.0–36.0)
MCV: 95.3 fL (ref 78.0–100.0)
MONOS PCT: 6 %
Monocytes Absolute: 2 10*3/uL — ABNORMAL HIGH (ref 0.1–1.0)
NEUTROS ABS: 28.7 10*3/uL — AB (ref 1.7–7.7)
Neutrophils Relative %: 86 %
Platelets: 48 10*3/uL — ABNORMAL LOW (ref 150–400)
RBC: 2.56 MIL/uL — ABNORMAL LOW (ref 3.87–5.11)
RDW: 19.1 % — ABNORMAL HIGH (ref 11.5–15.5)
WBC: 33.4 10*3/uL — ABNORMAL HIGH (ref 4.0–10.5)

## 2014-11-20 LAB — URINE CULTURE: Culture: 4000

## 2014-11-20 LAB — PROTIME-INR
INR: 1.71 — ABNORMAL HIGH (ref 0.00–1.49)
Prothrombin Time: 20.1 seconds — ABNORMAL HIGH (ref 11.6–15.2)

## 2014-11-20 LAB — C DIFFICILE QUICK SCREEN W PCR REFLEX
C DIFFICILE (CDIFF) TOXIN: NEGATIVE
C DIFFICLE (CDIFF) ANTIGEN: NEGATIVE
C Diff interpretation: NEGATIVE

## 2014-11-20 LAB — LACTIC ACID, PLASMA
Lactic Acid, Venous: 1 mmol/L (ref 0.5–2.0)
Lactic Acid, Venous: 1 mmol/L (ref 0.5–2.0)

## 2014-11-20 LAB — PROCALCITONIN: Procalcitonin: 0.8 ng/mL

## 2014-11-20 LAB — MRSA PCR SCREENING: MRSA by PCR: NEGATIVE

## 2014-11-20 LAB — APTT: APTT: 39 s — AB (ref 24–37)

## 2014-11-20 MED ORDER — VANCOMYCIN HCL 500 MG IV SOLR
500.0000 mg | Freq: Two times a day (BID) | INTRAVENOUS | Status: DC
  Administered 2014-11-20 – 2014-11-22 (×5): 500 mg via INTRAVENOUS
  Filled 2014-11-20 (×5): qty 500

## 2014-11-20 MED ORDER — IOHEXOL 300 MG/ML  SOLN
100.0000 mL | Freq: Once | INTRAMUSCULAR | Status: AC | PRN
  Administered 2014-11-20: 100 mL via INTRAVENOUS

## 2014-11-20 MED ORDER — IOHEXOL 300 MG/ML  SOLN
25.0000 mL | INTRAMUSCULAR | Status: AC
  Administered 2014-11-20 (×2): 25 mL via ORAL

## 2014-11-20 MED ORDER — POTASSIUM CHLORIDE CRYS ER 20 MEQ PO TBCR
40.0000 meq | EXTENDED_RELEASE_TABLET | Freq: Once | ORAL | Status: AC
  Administered 2014-11-20: 40 meq via ORAL
  Filled 2014-11-20: qty 2

## 2014-11-20 MED ORDER — CETYLPYRIDINIUM CHLORIDE 0.05 % MT LIQD
7.0000 mL | Freq: Two times a day (BID) | OROMUCOSAL | Status: DC
  Administered 2014-11-20 – 2014-11-28 (×14): 7 mL via OROMUCOSAL

## 2014-11-20 NOTE — Progress Notes (Signed)
ANTIBIOTIC CONSULT NOTE - INITIAL  Pharmacy Consult for Vancomycin IV, Zosyn, Vancomycin PO Indication: rule out sepsis, Management of Recurrent C.Diff  Allergies  Allergen Reactions  . Other     chlorhydrate - makes her hyper      Patient Measurements: Height: 5' (152.4 cm) Weight: 116 lb 6.5 oz (52.8 kg) IBW/kg (Calculated) : 45.5  Vital Signs: Temp: 98.8 F (37.1 C) (10/10 0800) Temp Source: Oral (10/10 0800) BP: 125/46 mmHg (10/10 0800) Pulse Rate: 96 (10/10 0800) Intake/Output from previous day: 10/09 0701 - 10/10 0700 In: 2900 [P.O.:50; I.V.:750; IV Piggyback:2100] Out: 1500 [Urine:1500]  Labs:  Recent Labs  11/19/14 1425 11/19/14 1459 11/20/14 0225  WBC 40.7*  --  33.4*  HGB 7.0* 7.8* 7.4*  PLT 61*  --  48*  CREATININE 1.02* 1.20* 0.80   Estimated Creatinine Clearance: 45 mL/min (by C-G formula based on Cr of 0.8). No results for input(s): VANCOTROUGH, VANCOPEAK, VANCORANDOM, GENTTROUGH, GENTPEAK, GENTRANDOM, TOBRATROUGH, TOBRAPEAK, TOBRARND, AMIKACINPEAK, AMIKACINTROU, AMIKACIN in the last 72 hours.    Assessment: 60 yoF was brought to ED on 10/9 with N/V/D, confusion, and fever.  PMH significant for AML on chemotherapy (at [facility]), recent admission at [facility] for Cdiff and finished course of Flagyl 1 week ago.  CXR (10/9) with possible pneumonia at left base.  Pharmacy is consulted to dose vancomycin IV and Zosyn for suspected sepsis and Vancomycin PO for management of recurrent C.Diff.  10/9 >> Zosyn >> 10/9 >> Vanc IV >>  10/9 >> Vanc PO >>  Today, 11/20/2014:  Lactic acid 3.34, improved to 1  Tm 101.9  WBC elevated but improved, 33.4  SCr improved to 0.8 with CrCl ~ 45 ml/min   Goal of Therapy:  Vancomycin trough level 15-20 mcg/ml  Appropriate abx dosing, eradication of infection.   Plan:   Continue Vancomycin 125mg  PO q6h.  Continue Zosyn 3.375g IV Q8H infused over 4hrs.  Increase to Vancomycin 500mg  IV q12h.  Measure Vanc trough at  steady state.  Follow up renal fxn, culture results, and clinical course.  Gretta Arab PharmD, BCPS Pager [phone] 11/20/2014 8:29 AM

## 2014-11-20 NOTE — Progress Notes (Signed)
[place]  Patient known to pharmacy from Vancomycin dosing for r/o sepsis.  Patient received Zosyn 3.375gm IV x 1 on 10/9 in the ED and upon admission, pharmacy consulted to continue dosing Zosyn for sepsis.  CrCl ~ 30 ml/min  Plan:  Zosyn 3.375gm IV q8h (each dose infused over 4 hrs)           F/U cultures results, clinical course, renal function  Leone Haven, PharmD 11/20/14 @ 01:08

## 2014-11-20 NOTE — Care Management Note (Signed)
Case Management Note  Patient Details  Name: Allison Burgess MRN: [id number] Date of Birth: 23-Dec-1940  Subjective/Objective:               Sepsis severe     Action/Plan:Date:  Oct. 10, 2016 U.R. performed for needs and level of care. Will continue to follow for Case Management needs.  Velva Harman, RN, BSN, [place]   [phone]    Expected Discharge Date:                  Expected Discharge Plan:  Home/Self Care  In-House Referral:  NA  Discharge planning Services  CM Consult  Post Acute Care Choice:  NA Choice offered to:  NA  DME Arranged:    DME Agency:     HH Arranged:    HH Agency:     Status of Service:  In process, will continue to follow  Medicare Important Message Given:    Date Medicare IM Given:    Medicare IM give by:    Date Additional Medicare IM Given:    Additional Medicare Important Message give by:     If discussed at [place] of Stay Meetings, dates discussed:    Additional Comments:  Leeroy Cha, RN 11/20/2014, 9:56 AM

## 2014-11-20 NOTE — Progress Notes (Signed)
[facility] PROGRESS NOTE   Allison Burgess ZOX:[id number] DOB: August 21, 1940 DOA: 11/19/2014 PCP: No primary care provider on file.  HPI/Subjective: Seen with different family members at bedside. Still has mild generalized abdominal pain and distention, no diarrhea since yesterday.  Assessment/Plan: Principal Problem:   Sepsis ([place]) Active Problems:   Nausea vomiting and diarrhea   Acute encephalopathy   Essential hypertension   AML (acute myelogenous leukemia) ([place])   PAD (peripheral artery disease) ([place])   Hyperlipidemia   Left flank pain    Severe sepsis Patient presented to the hospital with fever for 101.9, leukocytosis of 40K. Patient was hypotensive and required his pressors support, she was on levophed for brief period of time. CXR showed possible infiltrates, patient started empirically on Zosyn and vancomycin. Patient also have some diarrhea so as to rule out C. difficile colitis. CT with renal survey did not show any pyelonephritis, CXR showed mild left basilar pneumonia, patient is on Zosyn and vancomycin.  Possible mild left basilar pneumonia Started on vancomycin and Zosyn, no other sources of infection. Start bronchodilators, mucolytics, antitussives and oxygen as needed.  Left flank pain CT renal survey showed no acute issues to cause left flank pain. Patient has 1.5 cm right sided nephrolithiasis and 75mm bladder stone. CT of abdomen pelvis with contrast to be done, abdomen somewhat tympanic.  Acute renal failure Patient presented with creatinine 1.2, creatinine today 0.8 after IV fluid hydration.  History of AML Patient received chemotherapy and Neulasta shot. Likely the significant leukocytosis related to the Neulasta.  Acute encephalopathy This is likely secondary to sepsis, this is resolved.  Code Status: Full Code Family Communication: Plan discussed with the patient. Disposition Plan: Remains inpatient Diet: Diet Heart Room service  appropriate?: Yes; Fluid consistency:: Thin  Consultants:  None  Procedures:  None  Antibiotics:  Zosyn and vancomycin   Objective: Filed Vitals:   11/20/14 1300  BP: 119/44  Pulse: 83  Temp:   Resp: 24    Intake/Output Summary (Last 24 hours) at 11/20/14 1403 Last data filed at 11/20/14 1355  Gross per 24 hour  Intake   3750 ml  Output   1950 ml  Net   1800 ml   Filed Weights   11/19/14 1506 11/19/14 2300  Weight: 48.081 kg (106 lb) 52.8 kg (116 lb 6.5 oz)    Exam: General: Alert and awake, oriented x3, not in any acute distress. HEENT: anicteric sclera, pupils reactive to light and accommodation, EOMI CVS: S1-S2 clear, no murmur rubs or gallops Chest: clear to auscultation bilaterally, no wheezing, rales or rhonchi Abdomen: Generalized tenderness mild tympanic distention, normal bowel sounds, no organomegaly Extremities: no cyanosis, clubbing or edema noted bilaterally Neuro: Cranial nerves II-XII intact, no focal neurological deficits  Data Reviewed: Basic Metabolic Panel:  Recent Labs Lab 11/19/14 1425 11/19/14 1459 11/20/14 0225  NA 135 133* 140  K 3.9 4.1 3.6  CL 101 97* 111  CO2 25  --  22  GLUCOSE 120* 121* 103*  BUN 16 15 9   CREATININE 1.02* 1.20* 0.80  CALCIUM 8.1*  --  7.3*   Liver Function Tests:  Recent Labs Lab 11/19/14 1425 11/20/14 0225  AST 29 24  ALT 19 18  ALKPHOS 49 47  BILITOT 0.8 0.7  PROT 4.9* 4.9*  ALBUMIN 2.8* 2.8*   No results for input(s): LIPASE, AMYLASE in the last 168 hours. No results for input(s): AMMONIA in the last 168 hours. CBC:  Recent Labs Lab 11/19/14 1425 11/19/14 1459 11/20/14  0225  WBC 40.7*  --  33.4*  NEUTROABS 33.8*  --  28.7*  HGB 7.0* 7.8* 7.4*  HCT 22.2* 23.0* 24.4*  MCV 93.7  --  95.3  PLT 61*  --  48*   Cardiac Enzymes: No results for input(s): CKTOTAL, CKMB, CKMBINDEX, TROPONINI in the last 168 hours. BNP (last 3 results) No results for input(s): BNP in the last 8760  hours.  ProBNP (last 3 results) No results for input(s): PROBNP in the last 8760 hours.  CBG: No results for input(s): GLUCAP in the last 168 hours.  Micro Recent Results (from the past 240 hour(s))  Blood Culture (routine x 2)     Status: None (Preliminary result)   Collection Time: 11/19/14  2:37 PM  Result Value Ref Range Status   Specimen Description BLOOD BLOOD RIGHT FOREARM  Final   Special Requests BOTTLES DRAWN AEROBIC AND ANAEROBIC 5 CC EACH  Final   Culture   Final    NO GROWTH < 24 HOURS Performed at [facility]    Report Status PENDING  Incomplete  Blood Culture (routine x 2)     Status: None (Preliminary result)   Collection Time: 11/19/14  2:48 PM  Result Value Ref Range Status   Specimen Description BLOOD RIGHT HAND  Final   Special Requests BOTTLES DRAWN AEROBIC AND ANAEROBIC 5 CC EACH  Final   Culture   Final    NO GROWTH < 24 HOURS Performed at [facility]    Report Status PENDING  Incomplete  Urine culture     Status: None (Preliminary result)   Collection Time: 11/19/14  4:40 PM  Result Value Ref Range Status   Specimen Description URINE, CLEAN CATCH  Final   Special Requests NONE  Final   Culture   Final    CULTURE REINCUBATED FOR BETTER GROWTH Performed at [facility]    Report Status PENDING  Incomplete  MRSA PCR Screening     Status: None   Collection Time: 11/19/14 10:03 PM  Result Value Ref Range Status   MRSA by PCR NEGATIVE NEGATIVE Final    Comment:        The GeneXpert MRSA Assay (FDA approved for NASAL specimens only), is one component of a comprehensive MRSA colonization surveillance program. It is not intended to diagnose MRSA infection nor to guide or monitor treatment for MRSA infections.   C difficile quick scan w PCR reflex     Status: None   Collection Time: 11/20/14 11:43 AM  Result Value Ref Range Status   C Diff antigen NEGATIVE NEGATIVE Final   C Diff toxin NEGATIVE NEGATIVE Final   C Diff  interpretation Negative for toxigenic C. difficile  Final     Studies: Dg Chest Port 1 View  11/19/2014   CLINICAL DATA:  Cough and chest congestion over the last few days.  EXAM: PORTABLE CHEST 1 VIEW  COMPARISON:  None.  FINDINGS: Power port inserted from a right internal jugular approach has its tip at the SVC RA junction. The patient is rotated towards the left. Heart size is normal. The aorta shows calcification. Right lung is clear. The left lung is clear except for mild atelectasis or infiltrate at the base. No dense consolidation. No lobar collapse. No effusion.  IMPRESSION: Rotated towards the left. Mild patchy density at the left base that could represent atelectasis or mild left base pneumonia.   Electronically Signed   By: Nelson Chimes M.D.   On:  11/19/2014 15:43   Ct Renal Stone Study  11/19/2014   CLINICAL DATA:  74 year old who is currently undergoing chemotherapy for leukemia, presenting to the emergency department with fever, generalized abdominal pain and hypotension, possible sepsis.  EXAM: CT ABDOMEN AND PELVIS WITHOUT CONTRAST  TECHNIQUE: Multidetector CT imaging of the abdomen and pelvis was performed following the standard protocol without IV contrast.  COMPARISON:  None.  FINDINGS: Respiratory motion blurred many of the images of the upper and mid abdomen.  Hepatobiliary: Liver normal in size and appearance. No calcified gallstones. No gallbladder wall thickening or pericholecystic inflammation. No biliary ductal dilation.  Spleen: Enlarged measuring approximately 12.0 x 7.3 x 13.4 cm yielding a volume of approximately 600 ml. No focal splenic parenchymal abnormality allowing for the unenhanced technique.  Pancreas:  Normal unenhanced appearance.  Adrenal glands:  Normal in appearance.  Genitourinary: Approximate 1.5 cm nonobstructing calculus in a lower pole calix of the right kidney. Very small (2-3 mm) calculi in lower pole calices of the left kidney. No evidence of hydronephrosis  involving either kidney. Within the limits of the unenhanced technique, no focal parenchymal abnormality involving either kidney other than an exophytic simple cyst arising from the lower pole of the left kidney. Approximate 8 mm calculus in the dependent portion of the urinary bladder. Distended urinary bladder.  Multiple uterine fibroids, including a calcified, degenerated fibroid arising from the fundus approximate 3.1 x 3.3 cm cystic structure low in the left side of the pelvis which appears to arise from the lower uterine segment.  Gastrointestinal: Stomach normal in appearance for degree of distention. Normal-appearing small bowel. Moderate colonic stool burden. No evidence of diverticulosis. Normal appendix in the right upper pelvis.  Ascites: Small amount of ascites in the perihepatic and perisplenic regions. No significant ascites elsewhere.  Vascular: Severe aortoiliofemoral atherosclerosis. 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 Bilobed infrarenal abdominal aortic aneurysm with the upper and lower portions each having a maximum diameter of 3.0 cm. Bilateral common iliac artery stents.  Lymphatic:  No pathologic lymphadenopathy in the abdomen or pelvis.  Other findings: None.  Musculoskeletal: Thoracolumbar scoliosis convex left. Severe symmetric joint space narrowing involving both hips. Severe facet degenerative changes at L4-5 and L5-S1, left much greater than right. Osseous demineralization.  Visualized lower thorax: Heart enlarged with left ventricular predominance. Apparent right coronary artery stenting. Scarring in the lingula and left lower lobe.  IMPRESSION: 1. Small amount of ascites in the upper abdomen. 2. No acute abnormalities otherwise involving the abdomen or pelvis. 3. 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 Bilobed infrarenal abdominal aortic aneurysm with maximum diameter 3.0 cm. 4. Nonobstructing bilateral renal  calculi including a large (1.5 cm) calculus in the lower pole of the right kidney. 5. Approximate 8 mm calculus in the dependent portion of the distended urinary bladder. 6. Splenomegaly without evidence of focal splenic parenchymal abnormality. 7. Multiple uterine fibroids. What I believe is a degenerated, liquified approximate 3 cm fibroid arises from the left posterior lower uterine segment.   Electronically Signed   By: Evangeline Dakin M.D.   On: 11/19/2014 17:25    Scheduled Meds: . antiseptic oral rinse  7 mL Mouth Rinse BID  . atorvastatin  10 mg Oral Daily  . gabapentin  800 mg Oral TID  . metoprolol tartrate  12.5 mg Oral BID  . piperacillin-tazobactam (ZOSYN)  IV  3.375 g Intravenous Q8H  . vancomycin  125 mg Oral 4 times per day  .  vancomycin  500 mg Intravenous Q12H  . zolpidem  5 mg Oral QHS   Continuous Infusions: . sodium chloride 100 mL/hr at 11/19/14 2230       Time spent: 35 minutes    [facility] A  [facility] Pager [phone] If 7PM-7AM, please contact night-coverage at [email or web address], password [facility] 11/20/2014, 2:03 PM  LOS: 1 day

## 2014-11-20 NOTE — Progress Notes (Signed)
ANTIBIOTIC CONSULT NOTE - INITIAL  Pharmacy Consult for Management of Recurrent C.Diff  Allergies  Allergen Reactions  . Other     chlorhydrate - makes her hyper      Patient Measurements: Height: 5' (152.4 cm) Weight: 106 lb (48.081 kg) IBW/kg (Calculated) : 45.5  Vital Signs: Temp: 99.2 F (37.3 C) (10/09 1936) Temp Source: Oral (10/09 1936) BP: 120/55 mmHg (10/09 2113) Pulse Rate: 89 (10/09 2015) Intake/Output from previous day: 10/09 0701 - 10/10 0700 In: 2050 [IV Piggyback:2050] Out: 300 [Urine:300] Intake/Output from this shift: Total I/O In: -  Out: 300 [Urine:300]  Labs:  Recent Labs  11/19/14 1425 11/19/14 1459  WBC 40.7*  --   HGB 7.0* 7.8*  PLT 61*  --   CREATININE 1.02* 1.20*   Estimated Creatinine Clearance: 30 mL/min (by C-G formula based on Cr of 1.2). No results for input(s): VANCOTROUGH, VANCOPEAK, VANCORANDOM, GENTTROUGH, GENTPEAK, GENTRANDOM, TOBRATROUGH, TOBRAPEAK, TOBRARND, AMIKACINPEAK, AMIKACINTROU, AMIKACIN in the last 72 hours.   Microbiology: Recent Results (from the past 720 hour(s))  MRSA PCR Screening     Status: None   Collection Time: 11/19/14 10:03 PM  Result Value Ref Range Status   MRSA by PCR NEGATIVE NEGATIVE Final    Comment:        The GeneXpert MRSA Assay (FDA approved for NASAL specimens only), is one component of a comprehensive MRSA colonization surveillance program. It is not intended to diagnose MRSA infection nor to guide or monitor treatment for MRSA infections.     Medical History: Past Medical History  Diagnosis Date  . Cancer ([place])   . Blood transfusion without reported diagnosis   . COPD (chronic obstructive pulmonary disease) ([place])   . Hypertension   . 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 atorvastatin  10 mg Oral Daily  . gabapentin  800 mg Oral TID  . metoprolol tartrate  12.5 mg Oral BID  . piperacillin-tazobactam (ZOSYN)  IV  3.375 g Intravenous Q8H  .  vancomycin  125 mg Oral 4 times per day  . vancomycin  500 mg Intravenous Q24H  . zolpidem  5 mg Oral QHS   Infusions:  . sodium chloride 100 mL/hr at 11/19/14 2230   Assessment:  56yr with AML undergoing chemotherapy known to pharmacy from Vancomycin and zosyn dosing for sepsis.  Recent hospitalization for pneumonia, where patient developed C.Diff  PTA on metronidazole 500mg  po TID x 14 days  Upon admission, pharmacy consulted for management of recurrent CDiff  MD has already ordered Vancomycin 125mg  po QID, which is appropriate for hospialized patients and/or 1st recurrence.  Goal of Therapy:  Eradication of infection  Plan:  Continue as previously ordered:  Vancomycin 125mg  po QID (typical length of therapy = 14 days)  Allison Burgess, Toribio Harbour, PharmD 11/20/2014,1:09 AM

## 2014-11-21 DIAGNOSIS — D72829 Elevated white blood cell count, unspecified: Secondary | ICD-10-CM

## 2014-11-21 DIAGNOSIS — D638 Anemia in other chronic diseases classified elsewhere: Secondary | ICD-10-CM

## 2014-11-21 LAB — COMPREHENSIVE METABOLIC PANEL
ALBUMIN: 2.5 g/dL — AB (ref 3.5–5.0)
ALK PHOS: 47 U/L (ref 38–126)
ALT: 15 U/L (ref 14–54)
ANION GAP: 4 — AB (ref 5–15)
AST: 22 U/L (ref 15–41)
BUN: 8 mg/dL (ref 6–20)
CALCIUM: 7.6 mg/dL — AB (ref 8.9–10.3)
CO2: 22 mmol/L (ref 22–32)
Chloride: 109 mmol/L (ref 101–111)
Creatinine, Ser: 0.79 mg/dL (ref 0.44–1.00)
GFR calc non Af Amer: >60 mL/min (ref >60)
Glucose, Bld: 98 mg/dL (ref 65–99)
POTASSIUM: 4.1 mmol/L (ref 3.5–5.1)
SODIUM: 135 mmol/L (ref 135–145)
Total Bilirubin: 0.9 mg/dL (ref 0.3–1.2)
Total Protein: 4.7 g/dL — ABNORMAL LOW (ref 6.5–8.1)

## 2014-11-21 LAB — CBC
HEMATOCRIT: 21.8 % — AB (ref 36.0–46.0)
HEMOGLOBIN: 6.8 g/dL — AB (ref 12.0–15.0)
MCH: 29.4 pg (ref 26.0–34.0)
MCHC: 31.2 g/dL (ref 30.0–36.0)
MCV: 94.4 fL (ref 78.0–100.0)
Platelets: 46 10*3/uL — ABNORMAL LOW (ref 150–400)
RBC: 2.31 MIL/uL — AB (ref 3.87–5.11)
RDW: 19 % — ABNORMAL HIGH (ref 11.5–15.5)
WBC: 23.2 10*3/uL — ABNORMAL HIGH (ref 4.0–10.5)

## 2014-11-21 LAB — PREPARE RBC (CROSSMATCH)

## 2014-11-21 MED ORDER — FUROSEMIDE 10 MG/ML IJ SOLN
40.0000 mg | Freq: Once | INTRAMUSCULAR | Status: AC
  Administered 2014-11-21: 40 mg via INTRAVENOUS
  Filled 2014-11-21: qty 4

## 2014-11-21 MED ORDER — GABAPENTIN 400 MG PO CAPS
400.0000 mg | ORAL_CAPSULE | Freq: Three times a day (TID) | ORAL | Status: DC
  Administered 2014-11-21 – 2014-11-28 (×21): 400 mg via ORAL
  Filled 2014-11-21 (×23): qty 1

## 2014-11-21 MED ORDER — SODIUM CHLORIDE 0.9 % IV SOLN
Freq: Once | INTRAVENOUS | Status: AC
Start: 2014-11-21 — End: 2014-11-21
  Administered 2014-11-21: 11:00:00 via INTRAVENOUS

## 2014-11-21 NOTE — Progress Notes (Signed)
Patient ID: Allison Burgess, female   DOB: [birth year], 74 y.o.   MRN: [id number] [facility] PROGRESS NOTE  Allison Burgess SUP:[id number] DOB: Dec 10, 1940 DOA: 11/19/2014 PCP: patient follows with oncology in [facility]  Brief narrative:    74 y.o. female with past medical history of AML, on chemotherapy, hypertension, dyslipidemia who presented to ED with nausea, vomiting, diarrhea, left flank pain and confusion started 1-2 days prior to this admission. Patient also had fevers at home, T max 101 F. of note, patient has recently finished a course of Flagyl for C. difficile colitis. Last chemotherapy was about one week prior to this admission followed with Neulasta support. On admission, patient briefly required pressor support with Levophed. CT renal stone protocol on the admission showed no acute abnormalities in the abdomen or pelvis but it was noted she has a non-obstructing bilateral renal calculi. Also seen were multiple uterine fibroids. CT abdomen pelvis showed slight increasing splenomegaly. She also has stable mild aortic dilatation at the aortic hiatus. Chest x-ray showed mild patchy density at the left base that could represent possible left base pneumonia. Sepsis criteria met on the admission for which reason patient placed on broad-spectrum abx, Vanco and Zosyn.  Barrier to discharge: Hemoglobin down to 6.8 this morning for which reason we will give 2 units of PRBC transfusion, patient needs irradiated blood. We will continue to monitor in step down unit  Assessment/Plan:    Principal problem: Severe sepsis secondary to lobar pneumonia, unspecified organism / leukocytosis - Sepsis criteria met on the admission. Source of infection thought to be left lobe pneumonia. Patient started on vancomycin and Zosyn.  - C. difficile negative. Blood cultures so far did not show any growth. - Urine culture with insignificant growth. - Patient is hemodynamically stable. She does not  require pressor support. - Please note leukocytosis likely combination of Neulasta support as well as sepsis. Leukocytosis improving.  Active problems: Left flank pain / splenomegaly - CT renal stone protocol showed non-obstructing bilateral renal calculi. No evidence of pyelonephritis. - Patient still reports pain in the left abdomen. I think this is related to splenomegaly which was seen on CT abdomen. - Continue supportive care with analgesia as needed.  Acute encephalopathy - Likely related to sepsis. Mental status at baseline, she is oriented to time, place and person.  Acute renal failure - Likely secondary to prerenal etiology, sepsis  - Creatinine normalized with IV fluids.  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 - Patient follows with oncology in [facility].  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 Anemia likely secondary to malignancy and sequela of chemotherapy. - Patient needs 2 units of PRBC transfusion today. She requires irradiated blood.  Thrombocytopenia - Likely secondary to sequela of chemotherapy. - Platelet count 46 this morning. Continue to monitor daily CBC.  Moderate protein calorie malnutrition - In the context of chronic illness - Nutrition consulted   DVT Prophylaxis  - SCDs bilaterally.   Code Status: Full.  Family Communication:  plan of care discussed with the patient and her husband and son at the bedside Disposition Plan: Home once sepsis resolves and once CBC stable. Anticipated discharge by 11/24/2014.  IV access:  Peripheral IV  Procedures and diagnostic studies:    Ct Abdomen Pelvis W Contrast 11/20/2014   Slight increasing splenomegaly. Peripheral areas of low density noted which could reflect areas of  infarct.  New small bilateral pleural effusions and bibasilar atelectasis, left greater than  right.  Small amount of ascites around the liver, spleen and in the cul-de-sac.  Fibroid uterus.  Mild aortic dilatation at the aortic hiatus, 3.7 cm, stable.   Electronically Signed   By: Rolm Baptise M.D.   On: 11/20/2014 15:43   Dg Chest Port 1 View 11/19/2014 Rotated towards the left. Mild patchy density at the left base that could represent atelectasis or mild left base pneumonia.   Electronically Signed   By: Nelson Chimes M.D.   On: 11/19/2014 15:43   Ct Renal Stone Study 11/19/2014  1. Small amount of ascites in the upper abdomen. 2. No acute abnormalities otherwise involving the abdomen or pelvis. 3. 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 Bilobed infrarenal abdominal aortic aneurysm with maximum diameter 3.0 cm. 4. Nonobstructing bilateral renal calculi including a large (1.5 cm) calculus in the lower pole of the right kidney. 5. Approximate 8 mm calculus in the dependent portion of the distended urinary bladder. 6. Splenomegaly without evidence of focal splenic parenchymal abnormality. 7. Multiple uterine fibroids. What I believe is a degenerated, liquified approximate 3 cm fibroid arises from the left posterior lower uterine segment.   Electronically Signed   By: Evangeline Dakin M.D.   On: 11/19/2014 17:25   Medical Consultants:  None  Other Consultants:  Physical therapy Nutrition  IAnti-Infectives:   Zosyn and vancomycin 11/20/2014 -->   Leisa Lenz, MD  [facility] Pager [phone]  Time spent in minutes: 25 minutes  If 7PM-7AM, please contact night-coverage [email or web address] Password [email or web address] 11/21/2014, 8:11 AM   LOS: 2 days    HPI/Subjective: No acute overnight events. Patient reports pain in stomach, 10/10 this am.   Objective: Filed Vitals:   11/21/14 0000 11/21/14 0200 11/21/14 0400 11/21/14 0600  BP: 126/56 138/66 142/64 144/65  Pulse: 81 88 91 93  Temp: 98.9 F (37.2 C)  99 F (37.2 C)   TempSrc: Oral  Oral   Resp: _0 Height:      Weight:   55.5 kg (122 lb 5.7 oz)   SpO2: 97% 97% 96% 96%    Intake/Output Summary (Last 24 hours) at 11/21/14 0812 Last data filed at 11/21/14 0600  Gross per 24 hour  Intake 1742.92 ml  Output   1100 ml  Net 642.92 ml    Exam:   General:  Pt is alert, follows commands appropriately, not in acute distress  Cardiovascular: Regular rate and rhythm, S1/S2 appreciated   Respiratory: coarse breath sounds, congested, no wheezing   Abdomen: Soft, non tender, non distended, bowel sounds present  Extremities: No edema, pulses DP and PT palpable bilaterally  Neuro: Grossly nonfocal  Data Reviewed: Basic Metabolic Panel:  Recent Labs Lab 11/19/14 1425 11/19/14 1459 11/20/14 0225 11/21/14 0343  NA 135 133* 140 135  K 3.9 4.1 3.6 4.1  CL 101 97* 111 109  CO2 25  --  22 22  GLUCOSE 120* 121* 103* 98  BUN _1 CREATININE 1.02* 1.20* 0.80 0.79  CALCIUM 8.1*  --  7.3* 7.6*   Liver Function Tests:  Recent Labs Lab 11/19/14 1425 11/20/14 0225 11/21/14 0343  AST _2 ALT _3 ALKPHOS 49 47 47  BILITOT 0.8 0.7 0.9  PROT 4.9* 4.9* 4.7*  ALBUMIN 2.8* 2.8* 2.5*   No results for input(s): LIPASE, AMYLASE in the last 168  hours. No results for input(s): AMMONIA in the last 168 hours. CBC:  Recent Labs Lab 11/19/14 1425 11/19/14 1459 11/20/14 0225 11/21/14 0343  WBC 40.7*  --  33.4* 23.2*  NEUTROABS 33.8*  --  28.7*  --   HGB 7.0* 7.8* 7.4* 6.8*  HCT 22.2* 23.0* 24.4* 21.8*  MCV 93.7  --  95.3 94.4  PLT 61*  --  48* 46*   Cardiac Enzymes: No results for input(s): CKTOTAL, CKMB, CKMBINDEX, TROPONINI in the last 168 hours. BNP: Invalid input(s): POCBNP CBG: No results for input(s): GLUCAP in the last 168 hours.  Recent Results (from the past 240 hour(s))  Blood Culture (routine x 2)     Status: None (Preliminary result)   Collection Time: 11/19/14  2:37 PM  Result Value Ref Range Status   Specimen Description BLOOD BLOOD RIGHT  FOREARM  Final   Special Requests BOTTLES DRAWN AEROBIC AND ANAEROBIC 5 CC EACH  Final   Culture   Final    NO GROWTH < 24 HOURS Performed at [facility]    Report Status PENDING  Incomplete  Blood Culture (routine x 2)     Status: None (Preliminary result)   Collection Time: 11/19/14  2:48 PM  Result Value Ref Range Status   Specimen Description BLOOD RIGHT HAND  Final   Special Requests BOTTLES DRAWN AEROBIC AND ANAEROBIC 5 CC EACH  Final   Culture   Final    NO GROWTH < 24 HOURS Performed at [facility]    Report Status PENDING  Incomplete  Urine culture     Status: None   Collection Time: 11/19/14  4:40 PM  Result Value Ref Range Status   Specimen Description URINE, CLEAN CATCH  Final   Special Requests NONE  Final   Culture   Final    4,000 COLONIES/mL INSIGNIFICANT GROWTH Performed at [facility]    Report Status 11/20/2014 FINAL  Final  MRSA PCR Screening     Status: None   Collection Time: 11/19/14 10:03 PM  Result Value Ref Range Status   MRSA by PCR NEGATIVE NEGATIVE Final  C difficile quick scan w PCR reflex     Status: None   Collection Time: 11/20/14 11:43 AM  Result Value Ref Range Status   C Diff antigen NEGATIVE NEGATIVE Final   C Diff toxin NEGATIVE NEGATIVE Final   C Diff interpretation Negative for toxigenic C. difficile  Final     Scheduled Meds: . antiseptic oral rinse  7 mL Mouth Rinse BID  . atorvastatin  10 mg Oral Daily  . gabapentin  800 mg Oral TID  . metoprolol tartrate  12.5 mg Oral BID  . piperacillin-tazobactam (ZOSYN)  IV  3.375 g Intravenous Q8H  . vancomycin  125 mg Oral 4 times per day  . vancomycin  500 mg Intravenous Q12H  . zolpidem  5 mg Oral QHS   Continuous Infusions:

## 2014-11-21 NOTE — Progress Notes (Signed)
Patient ID: Allison Burgess, female   DOB: [birth year], 74 y.o.   MRN: [id number] [facility] PROGRESS NOTE  Allison Burgess KZL:[id number] DOB: 1940/06/04 DOA: 11/19/2014 PCP: patient follows with oncology in [facility]  Brief narrative:    74 y.o. female with past medical history of AML, on chemotherapy, hypertension, dyslipidemia who presented to ED with nausea, vomiting, diarrhea, left flank pain and confusion started 1-2 days prior to this admission. Patient also had fevers at home, T max 101 F. of note, patient has recently finished a course of Flagyl for C. difficile colitis. Last chemotherapy was about one week prior to this admission followed with Neulasta support. On admission, patient briefly required pressor support with Levophed. CT renal stone protocol on the admission showed no acute abnormalities in the abdomen or pelvis but it was noted she has a non-obstructing bilateral renal calculi. Also seen were multiple uterine fibroids. CT abdomen pelvis showed slight increasing splenomegaly. She also has stable mild aortic dilatation at the aortic hiatus. Chest x-ray showed mild patchy density at the left base that could represent possible left base pneumonia. Sepsis criteria met on the admission for which reason patient placed on broad-spectrum abx, Vanco and Zosyn.  Barrier to discharge: Hemoglobin down to 6.8 this morning for which reason we will give 2 units of PRBC transfusion, patient needs irradiated blood. We will continue to monitor in step down unit  Assessment/Plan:    Principal problem: Severe sepsis secondary to lobar pneumonia, unspecified organism / leukocytosis / Nausea, vomiting, diarrhea  - Sepsis criteria met on the admission. Source of infection thought to be left lobe pneumonia. Patient started on vancomycin and Zosyn.  - C. difficile negative. Blood cultures so far did not show any growth. - Urine culture with insignificant growth. - Patient is  hemodynamically stable. She does not require pressor support. - Please note leukocytosis likely combination of Neulasta support as well as sepsis. Leukocytosis improving.  Active problems: Left flank pain / splenomegaly - CT renal stone protocol showed non-obstructing bilateral renal calculi. No evidence of pyelonephritis. - Patient still reports pain in the left abdomen. I think this is related to splenomegaly which was seen on CT abdomen. - Continue supportive care with analgesia as needed.  Acute encephalopathy - Likely related to sepsis. Mental status at baseline, she is oriented to time, place and person.  Acute renal failure - Likely secondary to prerenal etiology, sepsis  - Creatinine normalized with IV fluids.  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 - Patient follows with oncology in [facility].  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 Anemia likely secondary to malignancy and sequela of chemotherapy. - Patient needs 2 units of PRBC transfusion today. She requires irradiated blood.  Thrombocytopenia - Likely secondary to sequela of chemotherapy. - Platelet count 46 this morning. Continue to monitor daily CBC.  Moderate protein calorie malnutrition - In the context of chronic illness - Nutrition consulted   DVT Prophylaxis  - SCDs bilaterally.   Code Status: Full.  Family Communication:  plan of care discussed with the patient and her husband and son at the bedside Disposition Plan: Home once sepsis resolves and once CBC stable. Anticipated discharge by 11/24/2014.  IV access:  Peripheral IV  Procedures and diagnostic studies:    Ct Abdomen Pelvis W Contrast 11/20/2014   Slight increasing splenomegaly. Peripheral areas of low density noted  which could reflect areas of infarct.  New small bilateral pleural effusions and  bibasilar atelectasis, left greater than right.  Small amount of ascites around the liver, spleen and in the cul-de-sac.  Fibroid uterus.  Mild aortic dilatation at the aortic hiatus, 3.7 cm, stable.   Electronically Signed   By: Rolm Baptise M.D.   On: 11/20/2014 15:43   Dg Chest Port 1 View 11/19/2014 Rotated towards the left. Mild patchy density at the left base that could represent atelectasis or mild left base pneumonia.   Electronically Signed   By: Nelson Chimes M.D.   On: 11/19/2014 15:43   Ct Renal Stone Study 11/19/2014  1. Small amount of ascites in the upper abdomen. 2. No acute abnormalities otherwise involving the abdomen or pelvis. 3. 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 Bilobed infrarenal abdominal aortic aneurysm with maximum diameter 3.0 cm. 4. Nonobstructing bilateral renal calculi including a large (1.5 cm) calculus in the lower pole of the right kidney. 5. Approximate 8 mm calculus in the dependent portion of the distended urinary bladder. 6. Splenomegaly without evidence of focal splenic parenchymal abnormality. 7. Multiple uterine fibroids. What I believe is a degenerated, liquified approximate 3 cm fibroid arises from the left posterior lower uterine segment.   Electronically Signed   By: Evangeline Dakin M.D.   On: 11/19/2014 17:25   Medical Consultants:  None  Other Consultants:  Physical therapy Nutrition  IAnti-Infectives:   Zosyn and vancomycin 11/20/2014 -->   Leisa Lenz, MD  [facility] Pager [phone]  Time spent in minutes: 25 minutes  If 7PM-7AM, please contact night-coverage [email or web address] Password [facility] 11/21/2014, 9:39 AM   LOS: 2 days    HPI/Subjective: No acute overnight events. Patient reports pain in stomach, 10/10 this am.   Objective: Filed Vitals:   11/21/14 0200 11/21/14 0400 11/21/14 0600 11/21/14 0800  BP: 138/66 142/64 144/65 147/71  Pulse: 88 91 93 86  Temp:  99 F (37.2 C)  98.7 F (37.1 C)   TempSrc:  Oral  Oral  Resp: 29 26 23 24   Height:      Weight:  55.5 kg (122 lb 5.7 oz)    SpO2: 97% 96% 96% 94%    Intake/Output Summary (Last 24 hours) at 11/21/14 0939 Last data filed at 11/21/14 0840  Gross per 24 hour  Intake 1642.92 ml  Output   1050 ml  Net 592.92 ml    Exam:   General:  Pt is alert, follows commands appropriately, not in acute distress  Cardiovascular: Regular rate and rhythm, S1/S2 appreciated   Respiratory: coarse breath sounds, congested, no wheezing   Abdomen: Soft, non tender, non distended, bowel sounds present  Extremities: No edema, pulses DP and PT palpable bilaterally  Neuro: Grossly nonfocal  Data Reviewed: Basic Metabolic Panel:  Recent Labs Lab 11/19/14 1425 11/19/14 1459 11/20/14 0225 11/21/14 0343  NA 135 133* 140 135  K 3.9 4.1 3.6 4.1  CL 101 97* 111 109  CO2 25  --  22 22  GLUCOSE 120* 121* 103* 98  BUN 16 15 9 8   CREATININE 1.02* 1.20* 0.80 0.79  CALCIUM 8.1*  --  7.3* 7.6*   Liver Function Tests:  Recent Labs Lab 11/19/14 1425 11/20/14 0225 11/21/14 0343  AST 29 24 22   ALT 19 18 15   ALKPHOS 49 47 47  BILITOT 0.8 0.7 0.9  PROT 4.9* 4.9* 4.7*  ALBUMIN 2.8* 2.8* 2.5*   No results for input(s): LIPASE,  AMYLASE in the last 168 hours. No results for input(s): AMMONIA in the last 168 hours. CBC:  Recent Labs Lab 11/19/14 1425 11/19/14 1459 11/20/14 0225 11/21/14 0343  WBC 40.7*  --  33.4* 23.2*  NEUTROABS 33.8*  --  28.7*  --   HGB 7.0* 7.8* 7.4* 6.8*  HCT 22.2* 23.0* 24.4* 21.8*  MCV 93.7  --  95.3 94.4  PLT 61*  --  48* 46*   Cardiac Enzymes: No results for input(s): CKTOTAL, CKMB, CKMBINDEX, TROPONINI in the last 168 hours. BNP: Invalid input(s): POCBNP CBG: No results for input(s): GLUCAP in the last 168 hours.  Recent Results (from the past 240 hour(s))  Blood Culture (routine x 2)     Status: None (Preliminary result)   Collection Time: 11/19/14  2:37 PM  Result Value Ref Range Status    Specimen Description BLOOD BLOOD RIGHT FOREARM  Final   Special Requests BOTTLES DRAWN AEROBIC AND ANAEROBIC 5 CC EACH  Final   Culture   Final    NO GROWTH < 24 HOURS Performed at [facility]    Report Status PENDING  Incomplete  Blood Culture (routine x 2)     Status: None (Preliminary result)   Collection Time: 11/19/14  2:48 PM  Result Value Ref Range Status   Specimen Description BLOOD RIGHT HAND  Final   Special Requests BOTTLES DRAWN AEROBIC AND ANAEROBIC 5 CC EACH  Final   Culture   Final    NO GROWTH < 24 HOURS Performed at [facility]    Report Status PENDING  Incomplete  Urine culture     Status: None   Collection Time: 11/19/14  4:40 PM  Result Value Ref Range Status   Specimen Description URINE, CLEAN CATCH  Final   Special Requests NONE  Final   Culture   Final    4,000 COLONIES/mL INSIGNIFICANT GROWTH Performed at [facility]    Report Status 11/20/2014 FINAL  Final  MRSA PCR Screening     Status: None   Collection Time: 11/19/14 10:03 PM  Result Value Ref Range Status   MRSA by PCR NEGATIVE NEGATIVE Final  C difficile quick scan w PCR reflex     Status: None   Collection Time: 11/20/14 11:43 AM  Result Value Ref Range Status   C Diff antigen NEGATIVE NEGATIVE Final   C Diff toxin NEGATIVE NEGATIVE Final   C Diff interpretation Negative for toxigenic C. difficile  Final     Scheduled Meds: . sodium chloride   Intravenous Once  . antiseptic oral rinse  7 mL Mouth Rinse BID  . atorvastatin  10 mg Oral Daily  . furosemide  40 mg Intravenous Once  . gabapentin  400 mg Oral TID  . metoprolol tartrate  12.5 mg Oral BID  . piperacillin-tazobactam (ZOSYN)  IV  3.375 g Intravenous Q8H  . vancomycin  500 mg Intravenous Q12H  . zolpidem  5 mg Oral QHS   Continuous Infusions:

## 2014-11-22 ENCOUNTER — Inpatient Hospital Stay (HOSPITAL_COMMUNITY): Payer: Medicare Other

## 2014-11-22 ENCOUNTER — Encounter (HOSPITAL_COMMUNITY): Payer: Self-pay | Admitting: General Surgery

## 2014-11-22 DIAGNOSIS — R161 Splenomegaly, not elsewhere classified: Secondary | ICD-10-CM

## 2014-11-22 DIAGNOSIS — E785 Hyperlipidemia, unspecified: Secondary | ICD-10-CM

## 2014-11-22 DIAGNOSIS — D6481 Anemia due to antineoplastic chemotherapy: Secondary | ICD-10-CM | POA: Diagnosis present

## 2014-11-22 DIAGNOSIS — J181 Lobar pneumonia, unspecified organism: Secondary | ICD-10-CM

## 2014-11-22 DIAGNOSIS — D638 Anemia in other chronic diseases classified elsewhere: Secondary | ICD-10-CM

## 2014-11-22 DIAGNOSIS — J189 Pneumonia, unspecified organism: Secondary | ICD-10-CM

## 2014-11-22 DIAGNOSIS — Z8619 Personal history of other infectious and parasitic diseases: Secondary | ICD-10-CM

## 2014-11-22 DIAGNOSIS — E876 Hypokalemia: Secondary | ICD-10-CM

## 2014-11-22 DIAGNOSIS — T451X5A Adverse effect of antineoplastic and immunosuppressive drugs, initial encounter: Secondary | ICD-10-CM

## 2014-11-22 DIAGNOSIS — D696 Thrombocytopenia, unspecified: Secondary | ICD-10-CM

## 2014-11-22 DIAGNOSIS — N179 Acute kidney failure, unspecified: Secondary | ICD-10-CM | POA: Diagnosis present

## 2014-11-22 DIAGNOSIS — R188 Other ascites: Secondary | ICD-10-CM

## 2014-11-22 DIAGNOSIS — J13 Pneumonia due to Streptococcus pneumoniae: Secondary | ICD-10-CM

## 2014-11-22 DIAGNOSIS — E440 Moderate protein-calorie malnutrition: Secondary | ICD-10-CM

## 2014-11-22 DIAGNOSIS — G620 Drug-induced polyneuropathy: Secondary | ICD-10-CM

## 2014-11-22 DIAGNOSIS — A419 Sepsis, unspecified organism: Secondary | ICD-10-CM | POA: Diagnosis present

## 2014-11-22 DIAGNOSIS — D72829 Elevated white blood cell count, unspecified: Secondary | ICD-10-CM | POA: Diagnosis present

## 2014-11-22 DIAGNOSIS — Z5189 Encounter for other specified aftercare: Secondary | ICD-10-CM

## 2014-11-22 HISTORY — DX: Hyperlipidemia, unspecified: E78.5

## 2014-11-22 HISTORY — DX: Sepsis, unspecified organism: A41.9

## 2014-11-22 HISTORY — DX: Drug-induced polyneuropathy: G62.0

## 2014-11-22 HISTORY — DX: Lobar pneumonia, unspecified organism: J18.1

## 2014-11-22 HISTORY — DX: Thrombocytopenia, unspecified: D69.6

## 2014-11-22 HISTORY — DX: Anemia in other chronic diseases classified elsewhere: D63.8

## 2014-11-22 HISTORY — DX: Moderate protein-calorie malnutrition: E44.0

## 2014-11-22 LAB — BASIC METABOLIC PANEL
Anion gap: 7 (ref 5–15)
BUN: 8 mg/dL (ref 6–20)
CALCIUM: 7.6 mg/dL — AB (ref 8.9–10.3)
CO2: 26 mmol/L (ref 22–32)
CREATININE: 0.9 mg/dL (ref 0.44–1.00)
Chloride: 103 mmol/L (ref 101–111)
GFR calc Af Amer: >60 mL/min (ref >60)
GFR calc non Af Amer: >60 mL/min (ref >60)
GLUCOSE: 87 mg/dL (ref 65–99)
Potassium: 3.2 mmol/L — ABNORMAL LOW (ref 3.5–5.1)
Sodium: 136 mmol/L (ref 135–145)

## 2014-11-22 LAB — TYPE AND SCREEN
ABO/RH(D): O POS
ANTIBODY SCREEN: NEGATIVE
UNIT DIVISION: 0
Unit division: 0

## 2014-11-22 LAB — CBC
HEMATOCRIT: 29.6 % — AB (ref 36.0–46.0)
Hemoglobin: 9.7 g/dL — ABNORMAL LOW (ref 12.0–15.0)
MCH: 28.8 pg (ref 26.0–34.0)
MCHC: 32.8 g/dL (ref 30.0–36.0)
MCV: 87.8 fL (ref 78.0–100.0)
Platelets: 32 10*3/uL — ABNORMAL LOW (ref 150–400)
RBC: 3.37 MIL/uL — ABNORMAL LOW (ref 3.87–5.11)
RDW: 20.3 % — AB (ref 11.5–15.5)
WBC: 9.9 10*3/uL (ref 4.0–10.5)

## 2014-11-22 LAB — GI PATHOGEN PANEL BY PCR, STOOL
C DIFFICILE TOXIN A/B: NOT DETECTED
CAMPYLOBACTER BY PCR: NOT DETECTED
Cryptosporidium by PCR: NOT DETECTED
E coli (ETEC) LT/ST: NOT DETECTED
E coli (STEC): NOT DETECTED
E coli 0157 by PCR: NOT DETECTED
G LAMBLIA BY PCR: NOT DETECTED
Norovirus GI/GII: NOT DETECTED
ROTAVIRUS A BY PCR: NOT DETECTED
SALMONELLA BY PCR: NOT DETECTED
SHIGELLA BY PCR: NOT DETECTED

## 2014-11-22 LAB — VANCOMYCIN, TROUGH: Vancomycin Tr: 11 ug/mL (ref 10.0–20.0)

## 2014-11-22 MED ORDER — BOOST PLUS PO LIQD
237.0000 mL | Freq: Two times a day (BID) | ORAL | Status: DC
  Administered 2014-11-22 – 2014-11-28 (×6): 237 mL via ORAL
  Filled 2014-11-22 (×15): qty 237

## 2014-11-22 MED ORDER — DIPHENHYDRAMINE HCL 12.5 MG/5ML PO ELIX
12.5000 mg | ORAL_SOLUTION | ORAL | Status: DC | PRN
  Administered 2014-11-22: 12.5 mg via ORAL
  Filled 2014-11-22: qty 5

## 2014-11-22 MED ORDER — FUROSEMIDE 10 MG/ML IJ SOLN
40.0000 mg | Freq: Once | INTRAMUSCULAR | Status: AC
  Administered 2014-11-22: 40 mg via INTRAVENOUS
  Filled 2014-11-22: qty 4

## 2014-11-22 MED ORDER — HYDROMORPHONE HCL 1 MG/ML IJ SOLN
1.0000 mg | INTRAMUSCULAR | Status: DC | PRN
  Administered 2014-11-22 – 2014-11-24 (×9): 1 mg via INTRAVENOUS
  Administered 2014-11-24 (×3): 2 mg via INTRAVENOUS
  Administered 2014-11-24 – 2014-11-25 (×3): 1 mg via INTRAVENOUS
  Filled 2014-11-22: qty 2
  Filled 2014-11-22 (×2): qty 1
  Filled 2014-11-22: qty 2
  Filled 2014-11-22 (×4): qty 1
  Filled 2014-11-22: qty 2
  Filled 2014-11-22 (×6): qty 1

## 2014-11-22 MED ORDER — POTASSIUM CHLORIDE CRYS ER 20 MEQ PO TBCR
40.0000 meq | EXTENDED_RELEASE_TABLET | Freq: Once | ORAL | Status: AC
  Administered 2014-11-22: 40 meq via ORAL
  Filled 2014-11-22: qty 2

## 2014-11-22 MED ORDER — DM-GUAIFENESIN ER 30-600 MG PO TB12
1.0000 | ORAL_TABLET | Freq: Two times a day (BID) | ORAL | Status: DC
  Administered 2014-11-22 – 2014-11-28 (×13): 1 via ORAL
  Filled 2014-11-22 (×14): qty 1

## 2014-11-22 MED ORDER — VANCOMYCIN HCL IN DEXTROSE 750-5 MG/150ML-% IV SOLN
750.0000 mg | Freq: Two times a day (BID) | INTRAVENOUS | Status: DC
  Administered 2014-11-22 – 2014-11-23 (×2): 750 mg via INTRAVENOUS
  Filled 2014-11-22 (×2): qty 150

## 2014-11-22 NOTE — Progress Notes (Signed)
Initial Nutrition Assessment  DOCUMENTATION CODES:   Non-severe (moderate) malnutrition in context of acute illness/injury  INTERVENTION:  - Will order Boost Plus BID, each supplement provides 360 kcal and 14 grams of protein - Encourage PO intakes of meals and supplements - RD will continue to monitor for needs  NUTRITION DIAGNOSIS:   Increased nutrient needs related to catabolic illness, cancer and cancer related treatments as evidenced by estimated needs.  GOAL:   Patient will meet greater than or equal to 90% of their needs  MONITOR:   PO intake, Supplement acceptance, Weight trends, Labs, I & O's  REASON FOR ASSESSMENT:   Consult Assessment of nutrition requirement/status  ASSESSMENT:   74 y.o. 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 As per the patient she had multiple episodes of nausea and vomiting and diarrhea last night. Patient was found to be confused today and was brought to the ER. Patient's family also stated patient was febrile in her house with temperatures running around 101F. Patient also has been having left flank pain and on exam patient had mild tenderness of the flank. Patient was recently admitted and discharged at [facility] 2 weeks ago for C. difficile colitis. Patient had just finished a course of Flagyl week ago. Patient had chemotherapy last week followed by Neulasta injection. In the ER patient was found to be initially hypotensive and was given fluid boluses and briefly was placed on Levophed.  Pt seen for consult. BMI indicates normal weight status. Family reports stable UBW of 106 lbs which was used for estimation of nutrition needs given fluid overload at this time. No intakes documented but family, who is at bedside, reports that pt has only been eating a few bites the past 3-4 days; examples given of jello and half of  a peanut butter sandwich. Pt states she is not feeling hungry this AM and did not eat breakfast. She denies abdominal pain or nausea at this time.   She states PTA she had a good appetite until she began to feel sick and have diarrhea (she states this was on 11/17/14). Pt indicates taste alteration which she experiences as bland taste since initiation of chemotherapy. At home she was drinking Boost and likes this supplement. She is interested in receiving it during hospitalization; will order BID and increase to TID if pt is drinking it more frequently.  Mild muscle and fat wasting noted. No previous weight hx in the chart but family reports pt has been stable at 106 lbs for several months prior to this admission. They state she gained fluid weight up to 122 lbs since admission and is now losing weight with diuresis.   Not meeting needs. Medications reviewed. Labs reviewed; K: 3.2 mmol/L, Ca: 7.6 mg/dL.    Diet Order:  Diet Heart Room service appropriate?: Yes; Fluid consistency:: Thin  Skin:  Reviewed, no issues  Last BM:  10/10  Height:   Ht Readings from Last 1 Encounters:  11/19/14 5' (1.524 m)    Weight:   Wt Readings from Last 1 Encounters:  11/22/14 117 lb 15.1 oz (53.5 kg)    Ideal Body Weight:  45.45 kg (kg)  BMI:  Body mass index is 23.03 kg/(m^2).  Estimated Nutritional Needs:   Kcal:  1450-1650  Protein:  55-65 grams  Fluid:  1.7 L/day  EDUCATION NEEDS:   No education needs identified  at this time     Jarome Matin, RD, LDN Inpatient Clinical Dietitian Pager # [phone] After hours/weekend pager # [phone]

## 2014-11-22 NOTE — Evaluation (Signed)
Physical Therapy Evaluation Patient Details Name: Allison Burgess MRN: [id number] DOB: 1940-04-20 Today's Date: 11/22/2014   History of Present Illness  Pt admitted with dx sepsis 2* PNA and pleural effusion on L.  Pt with hx of COPD and myeloblastic leukemia  Clinical Impression  Pt admitted as above and presenting with functional mobility limitations 2* generalized weakness, ambulatory instability and L side pain related to pleural effusion.  Pt should progress to dc home with family assist and HHPT dependent on acute stay progress.    Follow Up Recommendations Home health PT;No PT follow up (dependent on acute stay progress)    Equipment Recommendations  None recommended by PT    Recommendations for Other Services OT consult     Precautions / Restrictions Precautions Precautions: Fall Restrictions Weight Bearing Restrictions: No      Mobility  Bed Mobility Overal bed mobility: Needs Assistance Bed Mobility: Supine to Sit     Supine to sit: Min guard     General bed mobility comments: Increased time  Transfers Overall transfer level: Needs assistance Equipment used: 2 person hand held assist Transfers: Sit to/from Stand Sit to Stand: Min assist;+2 physical assistance;+2 safety/equipment         General transfer comment: cues for saftey, transition position and use of UEs to self assist  Ambulation/Gait Ambulation/Gait assistance: Min assist;+2 physical assistance;+2 safety/equipment Ambulation Distance (Feet): 123 Feet Assistive device: 2 person hand held assist Gait Pattern/deviations: Step-through pattern;Shuffle;Trunk flexed Gait velocity: decr Gait velocity interpretation: Below normal speed for age/gender General Gait Details: cues for pacing, posture and BOS.  HHA x 2 for stability - pt declines to attempt RW 2* increasing L side pain  Stairs            Wheelchair Mobility    Modified Rankin (Stroke Patients Only)       Balance Overall  balance assessment: Needs assistance Sitting-balance support: Feet supported;No upper extremity supported Sitting balance-Leahy Scale: Fair     Standing balance support: Bilateral upper extremity supported Standing balance-Leahy Scale: Poor                               Pertinent Vitals/Pain Pain Assessment: 0-10 Pain Score: 9  Pain Location: L side Pain Descriptors / Indicators: Aching Pain Intervention(s): Limited activity within patient's tolerance;Monitored during session;Premedicated before session;Patient requesting pain meds-RN notified    Home Living Family/patient expects to be discharged to:: Private residence Living Arrangements: Spouse/significant other Available Help at Discharge: Family Type of Home: House Home Access: Stairs to enter Entrance Stairs-Rails: Psychiatric nurse of Steps: 5 Home Layout: One level Home Equipment: None      Prior Function Level of Independence: Independent               Hand Dominance   Dominant Hand: Right    Extremity/Trunk Assessment   Upper Extremity Assessment: Generalized weakness           Lower Extremity Assessment: Generalized weakness      Cervical / Trunk Assessment: Kyphotic  Communication   Communication: No difficulties  Cognition Arousal/Alertness: Awake/alert Behavior During Therapy: WFL for tasks assessed/performed Overall Cognitive Status: Within Functional Limits for tasks assessed                      General Comments      Exercises General Exercises - Lower Extremity Ankle Circles/Pumps: AROM;Both;15 reps;Supine      Assessment/Plan  PT Assessment Patient needs continued PT services  PT Diagnosis Difficulty walking   PT Problem List Decreased strength;Decreased range of motion;Decreased activity tolerance;Decreased balance;Decreased mobility;Decreased knowledge of use of DME;Pain  PT Treatment Interventions DME instruction;Gait  training;Stair training;Functional mobility training;Therapeutic activities;Therapeutic exercise;Patient/family education   PT Goals (Current goals can be found in the Care Plan section) Acute Rehab PT Goals Patient Stated Goal: I need to walk PT Goal Formulation: With patient Time For Goal Achievement: 12/06/14 Potential to Achieve Goals: Good    Frequency Min 3X/week   Barriers to discharge        Co-evaluation               End of Session   Activity Tolerance: Patient limited by fatigue;Patient limited by pain Patient left: in chair;with call bell/phone within reach;with family/visitor present Nurse Communication: Mobility status;Patient requests pain meds         Time: 1510-1526 PT Time Calculation (min) (ACUTE ONLY): 16 min   Charges:   PT Evaluation $Initial PT Evaluation Tier I: 1 Procedure     PT G Codes:        Janos Shampine 20-Dec-2014, 5:00 PM

## 2014-11-22 NOTE — Progress Notes (Addendum)
Patient ID: Allison Burgess, female   DOB: 07-May-1940, 74 y.o.   MRN: [id number] [facility] PROGRESS NOTE  Allison Burgess FGH:[id number] DOB: [birth year] DOA: 11/19/2014 PCP: patient follows with oncology in [facility] Dr. Baird Cancer  Brief narrative:    74 y.o. female with past medical history of AML, on chemotherapy, hypertension, dyslipidemia who presented to ED with nausea, vomiting, diarrhea, left flank pain and confusion started 1-2 days prior to this admission. Patient also had fevers at home, T max 101 F. of note, patient has recently finished a course of Flagyl for C. difficile colitis. Last chemotherapy was about one week prior to this admission followed with Neulasta support. On admission, patient briefly required pressor support with Levophed. CT renal stone protocol on the admission showed no acute abnormalities in the abdomen or pelvis but it was noted she has a non-obstructing bilateral renal calculi. Also seen were multiple uterine fibroids. CT abdomen pelvis showed slight increasing splenomegaly. She also has stable mild aortic dilatation at the aortic hiatus. Chest x-ray showed mild patchy density at the left base that could represent possible left base pneumonia. Sepsis criteria met on the admission for which reason patient placed on broad-spectrum abx, Vanco and Zosyn.  10/11 - transfused 2 U PRBC (irradiated blood) 10/12 - surgery consulted    Assessment/Plan:    Principal problem: Severe sepsis secondary to lobar pneumonia, unspecified organism / leukocytosis / Nausea, vomiting, diarrhea  - Sepsis criteria met on the admission, pneumonia suspected source of infection. - Continue vanco and zosyn - Still sounds congested on physical exam so we will repeat CXR today - Has gotten lasix 40 mg IV once in between transfusions 10/11. Will give dose of lasix today 40 mg IV. - C. difficile negative. Blood cultures so far did not show any growth. - Urine culture shows insignificant  growth.  Active problems: Left flank pain / splenomegaly / ascites  - CT renal stone protocol showed non-obstructing bilateral renal calculi. No evidence of pyelonephritis. Small amount of fluid near liver and spleen with splenomegaly and ?splenic infarcts. Surgery consulted.  - Still has pain in left side of abdomen - Obtain abdominal US for eval of free fluid around liver and spleen   Acute encephalopathy - Secondary to sepsis.  - Mental status at baseline.   Acute renal failure - Secondary to sepsis  - Creatinine normalized with fluids.  Hypokalemia - Due to lasix - Supplemented.   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 - Patient follows with oncology in [facility], Dr. 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 - Hemoglobin down to 6.8 on 10/11. Transfused 2 U PRBC 10/11. - Hemoglobin 9.7 this am.  Thrombocytopenia - Likely secondary to malignancy and sequela of chemotherapy. - Platelet count 32 today. - Monitor daily CBC.  Dyslipidemia - Continue Lipitor   Essential hypertension - Continue metoprolol 12.5 mg PO BID  Chemotherapy induced neuropathy - Continue gabapentin   Moderate protein calorie malnutrition - In the context of chronic illness - Diet as tolerated - Seen by dietician  DVT Prophylaxis  - SCDs bilaterally in hospital due to thrombocytopenia   Code Status: Full.  Family Communication:  plan of care discussed with the patient and her husband and son at the bedside Disposition Plan: Remains in SDU.  IV access:  Peripheral IV  Procedures and diagnostic studies:  Ct Abdomen Pelvis W Contrast 11/20/2014   Slight increasing splenomegaly. Peripheral areas of low density noted which could reflect areas of infarct.  New small bilateral pleural effusions and bibasilar  atelectasis, left greater than right.  Small amount of ascites around the liver, spleen and in the cul-de-sac.  Fibroid uterus.  Mild aortic dilatation at the aortic hiatus, 3.7 cm, stable.   Electronically Signed   By: Rolm Baptise M.D.   On: 11/20/2014 15:43   Dg Chest Port 1 View 11/19/2014 Rotated towards the left. Mild patchy density at the left base that could represent atelectasis or mild left base pneumonia.   Electronically Signed   By: Nelson Chimes M.D.   On: 11/19/2014 15:43   Ct Renal Stone Study 11/19/2014  1. Small amount of ascites in the upper abdomen. 2. No acute abnormalities otherwise involving the abdomen or pelvis. 3. 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 Bilobed infrarenal abdominal aortic aneurysm with maximum diameter 3.0 cm. 4. Nonobstructing bilateral renal calculi including a large (1.5 cm) calculus in the lower pole of the right kidney. 5. Approximate 8 mm calculus in the dependent portion of the distended urinary bladder. 6. Splenomegaly without evidence of focal splenic parenchymal abnormality. 7. Multiple uterine fibroids. What I believe is a degenerated, liquified approximate 3 cm fibroid arises from the left posterior lower uterine segment.   Electronically Signed   By: Evangeline Dakin M.D.   On: 11/19/2014 17:25   Medical Consultants:  Surgery   Other Consultants:  Physical therapy Nutrition  IAnti-Infectives:   Zosyn and vancomycin 11/20/2014 -->   Leisa Lenz, MD  [facility] Pager [phone]  Time spent in minutes: 25 minutes  If 7PM-7AM, please contact night-coverage [email or web address] Password [email or web address] 11/22/2014, 11:04 AM   LOS: 3 days    HPI/Subjective: No acute overnight events. Patient reports pain in left side of abdomen 10/10.  Objective: Filed Vitals:   11/22/14 0800 11/22/14 0900 11/22/14 1000 11/22/14 1100  BP: 131/56     Pulse: 83 81 88 82  Temp: 98.5 F (36.9 C)     TempSrc: Oral     Resp: _0 Height:      Weight:      SpO2: 95% 95% 95% 94%    Intake/Output Summary (Last 24 hours) at 11/22/14 1104 Last data filed at 11/22/14 0830  Gross per 24 hour  Intake   1240 ml  Output   4050 ml  Net  -2810 ml    Exam:   General:  Pt is alert, not in acute distress  Cardiovascular: Rate controlled, (+) S1, S2    Respiratory: congested but no wheezing   Abdomen: (+) BS, tender in left abdomen, softly distended   Extremities: palpable pulses, no cyanosis   Neuro: Nonfocal  Data Reviewed: Basic Metabolic Panel:  Recent Labs Lab 11/19/14 1425 11/19/14 1459 11/20/14 0225 11/21/14 0343 11/22/14 0655  NA 135 133* 140 135 136  K 3.9 4.1 3.6 4.1 3.2*  CL 101 97* 111 109 103  CO2 25  --  _1 GLUCOSE 120* 121* 103* 98 87  BUN _2 CREATININE 1.02* 1.20* 0.80 0.79 0.90  CALCIUM 8.1*  --  7.3* 7.6* 7.6*   Liver Function Tests:  Recent Labs Lab 11/19/14 1425 11/20/14 0225 11/21/14 0343  AST _3 ALT _4 ALKPHOS 49 47 47  BILITOT 0.8 0.7 0.9  PROT  4.9* 4.9* 4.7*  ALBUMIN 2.8* 2.8* 2.5*   No results for input(s): LIPASE, AMYLASE in the last 168 hours. No results for input(s): AMMONIA in the last 168 hours. CBC:  Recent Labs Lab 11/19/14 1425 11/19/14 1459 11/20/14 0225 11/21/14 0343 11/22/14 0655  WBC 40.7*  --  33.4* 23.2* 9.9  NEUTROABS 33.8*  --  28.7*  --   --   HGB 7.0* 7.8* 7.4* 6.8* 9.7*  HCT 22.2* 23.0* 24.4* 21.8* 29.6*  MCV 93.7  --  95.3 94.4 87.8  PLT 61*  --  48* 46* 32*   Cardiac Enzymes: No results for input(s): CKTOTAL, CKMB, CKMBINDEX, TROPONINI in the last 168 hours. BNP: Invalid input(s): POCBNP CBG: No results for input(s): GLUCAP in the last 168 hours.  Recent Results (from the past 240 hour(s))  Blood Culture (routine x 2)     Status: None (Preliminary result)   Collection Time: 11/19/14  2:37 PM  Result Value Ref Range Status   Specimen Description BLOOD BLOOD RIGHT FOREARM  Final   Special  Requests BOTTLES DRAWN AEROBIC AND ANAEROBIC 5 CC EACH  Final   Culture   Final    NO GROWTH < 24 HOURS Performed at [facility]    Report Status PENDING  Incomplete  Blood Culture (routine x 2)     Status: None (Preliminary result)   Collection Time: 11/19/14  2:48 PM  Result Value Ref Range Status   Specimen Description BLOOD RIGHT HAND  Final   Special Requests BOTTLES DRAWN AEROBIC AND ANAEROBIC 5 CC EACH  Final   Culture   Final    NO GROWTH < 24 HOURS Performed at [facility]    Report Status PENDING  Incomplete  Urine culture     Status: None   Collection Time: 11/19/14  4:40 PM  Result Value Ref Range Status   Specimen Description URINE, CLEAN CATCH  Final   Special Requests NONE  Final   Culture   Final    4,000 COLONIES/mL INSIGNIFICANT GROWTH Performed at [facility]    Report Status 11/20/2014 FINAL  Final  MRSA PCR Screening     Status: None   Collection Time: 11/19/14 10:03 PM  Result Value Ref Range Status   MRSA by PCR NEGATIVE NEGATIVE Final  C difficile quick scan w PCR reflex     Status: None   Collection Time: 11/20/14 11:43 AM  Result Value Ref Range Status   C Diff antigen NEGATIVE NEGATIVE Final   C Diff toxin NEGATIVE NEGATIVE Final   C Diff interpretation Negative for toxigenic C. difficile  Final     Scheduled Meds: . antiseptic oral rinse  7 mL Mouth Rinse BID  . atorvastatin  10 mg Oral Daily  . dextromethorphan-guaiFENesin  1 tablet Oral BID  . furosemide  40 mg Intravenous Once  . gabapentin  400 mg Oral TID  . lactose free nutrition  237 mL Oral BID BM  . metoprolol tartrate  12.5 mg Oral BID  . piperacillin-tazobactam (ZOSYN)  IV  3.375 g Intravenous Q8H  . vancomycin  750 mg Intravenous Q12H  . zolpidem  5 mg Oral QHS   Continuous Infusions:

## 2014-11-22 NOTE — Consult Note (Signed)
Reason for Consult: Spleenomegaly Referring Physician: Dr. Leisa Lenz Oncologist:  In [facility], [place]  Verner Chol  Allison Burgess is an 74 y.o. female.  HPI: Pt admitted on 11/19/14 with nausea, vomiting diarrhea, fever, hypotension  and confusion.   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  She has a history of pneumonia followed by C diff colitis treated with Flagyl 2 weeks prior to admit in [facility]. I do not see a fever listed since admit. On admit  His WBC was 33.4 and is now down to 9.9 yesterday.   H/H went from a low of 6.8 to 9.7 after 2 units transfused yesterday. Platelets were 48,000 on admit, but are down to 32K yesterday.  Addition findings include possible pneumonia on the left by CXR on admit. She is on day 4 of Vancomycin and Zosyn.  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 10 mm nonobstructing lower pole right renal stone. Punctate non-obstructing stones in the lower pole of the left kidney. These are stable. No hydronephrosis.  Her pain is on the left side, flank up to shoulder on exam now.  Because of the findings with the spleen we are ask to see. He CXR this AM showsIncreasing left basilar infiltrate is noted when compare with the prior exam. Diffuse interstitial changes are again identified. No bony abnormality is seen. Right chest wall port is again seen and stable.   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 Portacath    . Bone marrow biopsy    . Pad stent      Family History  Problem Relation Age of Onset  . Leukemia Neg Hx   . CAD Neg Hx   . Diabetes Mellitus II Neg Hx     Social History:  reports that she quit smoking about 2 weeks ago. She does not have any smokeless tobacco history on file. She reports that she does not drink alcohol or use illicit drugs.  Allergies:  Allergies  Allergen Reactions  . Other     chlorhydrate - makes her hyper      Medications:  Prior to Admission:  Prescriptions prior to admission  Medication Sig Dispense Refill Last Dose  . amLODipine (NORVASC) 10 MG tablet Take 10 mg by mouth daily.   11/18/2014 at Unknown time  . atorvastatin (LIPITOR) 10 MG tablet Take 10 mg by mouth daily.   11/18/2014 at Unknown time  . dexamethasone (DECADRON) 4 MG tablet Take 4 mg by mouth daily. For 1 week following chemo therapy   unknown  . diphenhydrAMINE (BENADRYL) 25 mg capsule Take 50 mg by mouth at bedtime.   11/18/2014 at Unknown time  . gabapentin (NEURONTIN) 800 MG tablet Take 800 mg by mouth 3 (three) times daily.   11/19/2014 at 0900  . hydrocortisone cream  0.5 % Apply 1 application topically 2 (two) times daily as needed (for bruising).   unknown  . levofloxacin (LEVAQUIN) 500 MG tablet Take 500 mg by mouth daily. For 10 days   unknown  . metoprolol tartrate (LOPRESSOR) 25 MG tablet Take 12.5 mg by mouth 2 (two) times daily.   11/19/2014 at 0900  . metroNIDAZOLE (FLAGYL) 500 MG tablet Take 500 mg by mouth 3 (three) times daily. For 14 days   11/19/2014 at 1200  . oxycodone (OXY-IR) 5 MG capsule Take 5 mg by mouth every 4 (four) hours as needed for pain.   11/18/2014 at Unknown time  . OXYGEN Inhale 2 L into the lungs at bedtime.   11/18/2014 at Unknown time  . PRESCRIPTION MEDICATION Chemo - [facility] [phone] Dr. Baird Cancer   11/17/2014   Scheduled: . antiseptic oral rinse  7  mL Mouth Rinse BID  . atorvastatin  10 mg Oral Daily  . dextromethorphan-guaiFENesin  1 tablet Oral BID  . gabapentin  400 mg Oral TID  . lactose free nutrition  237 mL Oral BID BM  . metoprolol tartrate  12.5 mg Oral BID  . piperacillin-tazobactam (ZOSYN)  IV  3.375 g Intravenous Q8H  . vancomycin  750 mg Intravenous Q12H  . zolpidem  5 mg Oral QHS   Continuous:  KPT:WSFKCLEXNTZGY **OR** acetaminophen, HYDROmorphone (DILAUDID) injection, ondansetron **OR** ondansetron (ZOFRAN) IV, oxyCODONE Anti-infectives    Start     Dose/Rate Route Frequency Ordered Stop   11/22/14 1800  vancomycin (VANCOCIN) IVPB 750 mg/150 ml premix     750 mg 150 mL/hr over 60 Minutes Intravenous Every 12 hours 11/22/14 0818     11/20/14 1600  vancomycin (VANCOCIN) 500 mg in sodium chloride 0.9 % 100 mL IVPB  Status:  Discontinued     500 mg 100 mL/hr over 60 Minutes Intravenous Every 24 hours 11/19/14 1524 11/19/14 2319   11/20/14 1600  vancomycin (VANCOCIN) 500 mg in sodium chloride 0.9 % 100 mL IVPB  Status:  Discontinued     500 mg 100 mL/hr over 60 Minutes Intravenous Every 24 hours 11/19/14 2331 11/20/14 0752   11/20/14 0800  vancomycin (VANCOCIN) 500 mg in sodium chloride 0.9 % 100 mL IVPB  Status:  Discontinued     500 mg 100 mL/hr over 60 Minutes Intravenous Every 12 hours 11/20/14 0752 11/22/14 0818   11/19/14 2359  piperacillin-tazobactam (ZOSYN) IVPB 3.375 g     3.375 g 12.5 mL/hr over 240 Minutes Intravenous Every 8 hours 11/19/14 2333     11/19/14 2330  piperacillin-tazobactam (ZOSYN) IVPB 3.375 g  Status:  Discontinued     3.375 g 100 mL/hr over 30 Minutes Intravenous  Once 11/19/14 2319 11/19/14 2332   11/19/14 2330  vancomycin (VANCOCIN) IVPB 1000 mg/200 mL premix  Status:  Discontinued     1,000 mg 200 mL/hr over 60 Minutes Intravenous  Once 11/19/14 2319 11/19/14 2331   11/19/14 1815  vancomycin (VANCOCIN) 50 mg/mL oral solution 125 mg  Status:  Discontinued     125 mg Oral 4 times per  day 11/19/14 1806 11/21/14 0849   11/19/14 1530  vancomycin (VANCOCIN) IVPB 1000 mg/200 mL premix     1,000 mg 200 mL/hr over 60 Minutes Intravenous STAT 11/19/14 1516 11/19/14 1702   11/19/14 1430  piperacillin-tazobactam (ZOSYN) IVPB 3.375 g     3.375 g 100 mL/hr over 30 Minutes Intravenous  Once 11/19/14 1418 11/19/14 1559  Results for orders placed or performed during the hospital encounter of 11/19/14 (from the past 48 hour(s))  Comprehensive metabolic panel     Status: Abnormal   Collection Time: 11/21/14  3:43 AM  Result Value Ref Range   Sodium 135 135 - 145 mmol/L   Potassium 4.1 3.5 - 5.1 mmol/L   Chloride 109 101 - 111 mmol/L   CO2 22 22 - 32 mmol/L   Glucose, Bld 98 65 - 99 mg/dL   BUN 8 6 - 20 mg/dL   Creatinine, Ser 0.79 0.44 - 1.00 mg/dL   Calcium 7.6 (L) 8.9 - 10.3 mg/dL   Total Protein 4.7 (L) 6.5 - 8.1 g/dL   Albumin 2.5 (L) 3.5 - 5.0 g/dL   AST 22 15 - 41 U/L   ALT 15 14 - 54 U/L   Alkaline Phosphatase 47 38 - 126 U/L   Total Bilirubin 0.9 0.3 - 1.2 mg/dL   GFR calc non Af Amer >60 >60 mL/min   GFR calc Af Amer >60 >60 mL/min    Comment: (NOTE) The eGFR has been calculated using the CKD EPI equation. This calculation has not been validated in all clinical situations. eGFR's persistently <60 mL/min signify possible Chronic Kidney Disease.    Anion gap 4 (L) 5 - 15  CBC     Status: Abnormal   Collection Time: 11/21/14  3:43 AM  Result Value Ref Range   WBC 23.2 (H) 4.0 - 10.5 K/uL   RBC 2.31 (L) 3.87 - 5.11 MIL/uL   Hemoglobin 6.8 (LL) 12.0 - 15.0 g/dL    Comment: REPEATED TO VERIFY CRITICAL RESULT CALLED TO, READ BACK BY AND VERIFIED WITH: C.DENNY,RN AT 0447 ON 11/21/14 BY W.SHEA    HCT 21.8 (L) 36.0 - 46.0 %   MCV 94.4 78.0 - 100.0 fL   MCH 29.4 26.0 - 34.0 pg   MCHC 31.2 30.0 - 36.0 g/dL   RDW 19.0 (H) 11.5 - 15.5 %   Platelets 46 (L) 150 - 400 K/uL    Comment: REPEATED TO VERIFY CONSISTENT WITH PREVIOUS RESULT   Prepare RBC     Status:  None   Collection Time: 11/21/14  9:00 AM  Result Value Ref Range   Order Confirmation ORDER PROCESSED BY BLOOD BANK   Vancomycin, trough     Status: None   Collection Time: 11/22/14  6:55 AM  Result Value Ref Range   Vancomycin Tr 11 10.0 - 20.0 ug/mL  CBC     Status: Abnormal   Collection Time: 11/22/14  6:55 AM  Result Value Ref Range   WBC 9.9 4.0 - 10.5 K/uL   RBC 3.37 (L) 3.87 - 5.11 MIL/uL   Hemoglobin 9.7 (L) 12.0 - 15.0 g/dL    Comment: REPEATED TO VERIFY DELTA CHECK NOTED POST TRANSFUSION SPECIMEN    HCT 29.6 (L) 36.0 - 46.0 %   MCV 87.8 78.0 - 100.0 fL    Comment: REPEATED TO VERIFY DELTA CHECK NOTED POST TRANSFUSION SPECIMEN    MCH 28.8 26.0 - 34.0 pg   MCHC 32.8 30.0 - 36.0 g/dL   RDW 20.3 (H) 11.5 - 15.5 %   Platelets 32 (L) 150 - 400 K/uL    Comment: SPECIMEN CHECKED FOR CLOTS REPEATED TO VERIFY PLATELET COUNT CONFIRMED BY SMEAR   Basic metabolic panel     Status: Abnormal   Collection Time: 11/22/14  6:55 AM  Result Value Ref Range   Sodium 136 135 - 145 mmol/L   Potassium  3.2 (L) 3.5 - 5.1 mmol/L    Comment: DELTA CHECK NOTED REPEATED TO VERIFY    Chloride 103 101 - 111 mmol/L   CO2 26 22 - 32 mmol/L   Glucose, Bld 87 65 - 99 mg/dL   BUN 8 6 - 20 mg/dL   Creatinine, Ser 0.90 0.44 - 1.00 mg/dL   Calcium 7.6 (L) 8.9 - 10.3 mg/dL   GFR calc non Af Amer >60 >60 mL/min   GFR calc Af Amer >60 >60 mL/min    Comment: (NOTE) The eGFR has been calculated using the CKD EPI equation. This calculation has not been validated in all clinical situations. eGFR's persistently <60 mL/min signify possible Chronic Kidney Disease.    Anion gap 7 5 - 15    US Abdomen Complete  11/22/2014  CLINICAL DATA:  Ascites. EXAM: ULTRASOUND ABDOMEN COMPLETE COMPARISON:  CT 11/20/2014. FINDINGS: Gallbladder: Multiple tiny approximately 3 mm gallbladder polyps. Gallbladder wall thickness normal at 1.1 mm. Negative Murphy sign. Common bile duct: Diameter: 7.6 mm Liver: No focal  hepatic abnormality.  Hepatic echotexture normal. IVC: No abnormality visualized. Pancreas: Visualized portion unremarkable. Spleen: Spleen is enlarged 15 cm with a volume of 857 cubic cm. Right Kidney: Length: 11.9 cm. Echogenicity within normal limits. No mass or hydronephrosis visualized. 12 mm nonobstructing lower pole calyceal stone. Left Kidney: Length: 10.9 cm. Echogenicity within normal limits. No mass or hydronephrosis visualized. Abdominal aorta: No aneurysm visualized. Other findings: Mild ascites. IMPRESSION: 1.  Splenomegaly. 2 mild ascites. 3.  Nonobstructing left nephrolithiasis. Electronically Signed   By: Cantu Addition   On: 11/22/2014 09:29   Ct Abdomen Pelvis W Contrast  11/20/2014  CLINICAL DATA:  Leukemia. Ongoing chemotherapy. Abdominal pain and distention. Vomiting, diarrhea. EXAM: CT ABDOMEN AND PELVIS WITH CONTRAST TECHNIQUE: Multidetector CT imaging of the abdomen and pelvis was performed using the standard protocol following bolus administration of intravenous contrast. CONTRAST:  139m OMNIPAQUE IOHEXOL 300 MG/ML  SOLN COMPARISON:  11/19/2014 FINDINGS: Small bilateral pleural effusions, new since prior study, left slightly larger than right. Dependent bibasilar atelectasis. Heart is borderline in size. Mild dilatation of the aorta at the hiatus, measuring 3.7 cm, stable. Enlarged spleen with a craniocaudal length of 13.7 cm compared to 13.4 cm previously. There is a heterogeneous enhancement pattern peripherally within the spleen. This may simply be related to bolus timing and the enlarged spleen, but this persists on delayed kidney images. This may reflect areas of splenic infarcts. Liver, gallbladder, pancreas, adrenals are unremarkable. 10 mm nonobstructing lower pole right renal stone. Punctate nonobstructing stones in the lower pole of the left kidney. These are stable. No hydronephrosis. Small amount of free fluid around the liver and spleen as well as in the cul-de-sac.  Fibroid uterus noted, some which are calcified. No adnexal masses. Urinary bladder is unremarkable. Stomach, large and small bowel are unremarkable. Aorta is heavily calcified with maximum diameter 2.5 cm distally. Right common iliac stent noted. No acute bony abnormality or focal bone lesion. IMPRESSION: Slight increasing splenomegaly. Peripheral areas of low density noted which could reflect areas of infarct. New small bilateral pleural effusions and bibasilar atelectasis, left greater than right. Small amount of ascites around the liver, spleen and in the cul-de-sac. Fibroid uterus. Mild aortic dilatation at the aortic hiatus, 3.7 cm, stable. Electronically Signed   By: KRolm BaptiseM.D.   On: 11/20/2014 15:43    Review of Systems  Constitutional: Negative.   HENT: Negative.   Eyes: Negative.   Respiratory:  She has pain on the left side from her flank up to left shoulder.  It started there and that remains her biggest issue.  Cardiovascular: Negative.   Gastrointestinal: Positive for nausea, vomiting and diarrhea. Negative for abdominal pain, constipation, blood in stool and melena.       Left flank pain  Genitourinary: Negative.   Musculoskeletal: Negative.   Skin: Negative.   Neurological: Negative.   Endo/Heme/Allergies: Bruises/bleeds easily.  Psychiatric/Behavioral: Negative.    Blood pressure 131/56, pulse 82, temperature 98.5 F (36.9 C), temperature source Oral, resp. rate 18, height 5' (1.524 m), weight 53.5 kg (117 lb 15.1 oz), SpO2 94 %. Physical Exam  Constitutional: She is oriented to person, place, and time. No distress.  Frail elderly WF on O2, but no acute distress.   She appears chronically ill.  HENT:  Head: Normocephalic.  Nose: Nose normal.  Eyes: Conjunctivae and EOM are normal. Right eye exhibits no discharge. Left eye exhibits no discharge. No scleral icterus.  Neck: Normal range of motion. Neck supple. No JVD present. No tracheal deviation present. No  thyromegaly present.  Cardiovascular: Normal rate, regular rhythm and normal heart sounds.   No murmur heard. Respiratory: Effort normal. No respiratory distress. She has no wheezes. She has no rales. She exhibits tenderness (her pain goes from left flank to her left shoulder.).  On O2 Breath sounds down in the bases, L more than right.  GI: Soft. Bowel sounds are normal. She exhibits no distension and no mass. There is no tenderness. There is no rebound and no guarding.  Pain left flank and side going to left shoulder.  Musculoskeletal: She exhibits no edema or tenderness.  Lymphadenopathy:    She has no cervical adenopathy.  Neurological: She is alert and oriented to person, place, and time. No cranial nerve deficit.  Skin: Skin is warm. No rash noted. She is diaphoretic. No erythema. There is pallor.  Psychiatric: She has a normal mood and affect. Her behavior is normal. Judgment and thought content normal.    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 Severe Anemia/thrombcytopenia Hypotension secondary to sepsis Prior hx of pneumonia and C diff colitis in [facility] COPD/ 100 + pack year history of tobacco use, quit 2 years ago    Plan:  From our standpoint all her pain and discomfort is the left side flank going to left shoulder.  No abdominal pain. I do not think her enlarged spleen is related to her current issue, and is most likely associated with the long term AML. I will discuss with Dr. Harlow Asa and we will follow along.  Zebulun Deman 11/22/2014, 12:26 PM

## 2014-11-22 NOTE — Progress Notes (Signed)
ANTIBIOTIC CONSULT NOTE - Follow up  Pharmacy Consult for Vancomycin, Zosyn Indication: rule out sepsis  Allergies  Allergen Reactions  . Other     chlorhydrate - makes her hyper      Patient Measurements: Height: 5' (152.4 cm) Weight: 117 lb 15.1 oz (53.5 kg) IBW/kg (Calculated) : 45.5  Vital Signs: Temp: 99.6 F (37.6 C) (10/12 0400) Temp Source: Oral (10/12 0000) BP: 128/49 mmHg (10/12 0400) Pulse Rate: 71 (10/12 0400) Intake/Output from previous day: 10/11 0701 - 10/12 0700 In: 1779 [P.O.:200; I.V.:300; Blood:670; IV Piggyback:250] Out: 4500 [Urine:4500]  Labs:  Recent Labs  11/19/14 1425 11/19/14 1459 11/20/14 0225 11/21/14 0343  WBC 40.7*  --  33.4* 23.2*  HGB 7.0* 7.8* 7.4* 6.8*  PLT 61*  --  48* 46*  CREATININE 1.02* 1.20* 0.80 0.79   Estimated Creatinine Clearance: 45 mL/min (by C-G formula based on Cr of 0.79). No results for input(s): VANCOTROUGH, VANCOPEAK, VANCORANDOM, GENTTROUGH, GENTPEAK, GENTRANDOM, TOBRATROUGH, TOBRAPEAK, TOBRARND, AMIKACINPEAK, AMIKACINTROU, AMIKACIN in the last 72 hours.    Assessment: 8 yoF was brought to ED on 10/9 with N/V/D, confusion, and fever.  PMH significant for AML on chemotherapy (at [facility]), recent admission at [facility] for Cdiff and finished course of Flagyl 1 week ago.  CXR (10/9) with possible pneumonia at left base.  Pharmacy is consulted to dose vancomycin IV and Zosyn for suspected sepsis.  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  10/9 >> Zosyn >> 10/9 >> Vanc IV >>  10/9 >> Vanc PO >> 10/11  Today, 11/22/2014:  Tm 99.6  WBC elevated but improved, 23.2 (10/11)  SCr 0.9 with CrCl ~ 4 ml/min  Vancomycin trough level 11, subtherapeutic   Goal of Therapy:  Vancomycin trough level 15-20 mcg/ml  Appropriate abx dosing, eradication of infection.   Plan:   Continue Zosyn 3.375g IV Q8H infused over 4hrs.  Increase to Vancomycin 750mg  IV  q12h.  Measure Vanc trough at steady state.  Follow up renal fxn, culture results, and clinical course.  Gretta Arab PharmD, BCPS Pager [phone] 11/22/2014 7:15 AM

## 2014-11-23 LAB — BASIC METABOLIC PANEL
ANION GAP: 5 (ref 5–15)
BUN: 8 mg/dL (ref 6–20)
CALCIUM: 7.7 mg/dL — AB (ref 8.9–10.3)
CO2: 32 mmol/L (ref 22–32)
CREATININE: 0.72 mg/dL (ref 0.44–1.00)
Chloride: 97 mmol/L — ABNORMAL LOW (ref 101–111)
GFR calc Af Amer: >60 mL/min (ref >60)
GLUCOSE: 114 mg/dL — AB (ref 65–99)
Potassium: 3.4 mmol/L — ABNORMAL LOW (ref 3.5–5.1)
Sodium: 134 mmol/L — ABNORMAL LOW (ref 135–145)

## 2014-11-23 LAB — CBC
HEMATOCRIT: 27.9 % — AB (ref 36.0–46.0)
Hemoglobin: 8.9 g/dL — ABNORMAL LOW (ref 12.0–15.0)
MCH: 28.2 pg (ref 26.0–34.0)
MCHC: 31.9 g/dL (ref 30.0–36.0)
MCV: 88.3 fL (ref 78.0–100.0)
PLATELETS: 38 10*3/uL — AB (ref 150–400)
RBC: 3.16 MIL/uL — ABNORMAL LOW (ref 3.87–5.11)
RDW: 20 % — AB (ref 11.5–15.5)
WBC: 7.6 10*3/uL (ref 4.0–10.5)

## 2014-11-23 MED ORDER — IPRATROPIUM-ALBUTEROL 0.5-2.5 (3) MG/3ML IN SOLN: 3.0000 mL | RESPIRATORY_TRACT | Status: DC | PRN

## 2014-11-23 MED ORDER — SODIUM CHLORIDE 0.9 % IJ SOLN
10.0000 mL | Freq: Two times a day (BID) | INTRAMUSCULAR | Status: DC
  Administered 2014-11-23 – 2014-11-28 (×4): 10 mL

## 2014-11-23 MED ORDER — ZOLPIDEM TARTRATE 5 MG PO TABS: 5.0000 mg | ORAL_TABLET | Freq: Every day | ORAL | Status: DC

## 2014-11-23 MED ORDER — SODIUM CHLORIDE 0.9 % IJ SOLN
10.0000 mL | INTRAMUSCULAR | Status: DC | PRN
  Administered 2014-11-24 – 2014-11-28 (×2): 10 mL
  Filled 2014-11-23 (×2): qty 40

## 2014-11-23 MED ORDER — DIPHENHYDRAMINE HCL 25 MG PO CAPS: 25.0000 mg | ORAL_CAPSULE | Freq: Every day | ORAL | Status: DC

## 2014-11-23 MED ORDER — DIPHENHYDRAMINE HCL 25 MG PO CAPS
25.0000 mg | ORAL_CAPSULE | Freq: Every day | ORAL | Status: DC
  Administered 2014-11-23 – 2014-11-27 (×5): 25 mg via ORAL
  Filled 2014-11-23 (×6): qty 1

## 2014-11-23 MED ORDER — LEVOFLOXACIN IN D5W 750 MG/150ML IV SOLN
750.0000 mg | INTRAVENOUS | Status: DC
  Administered 2014-11-23: 750 mg via INTRAVENOUS
  Filled 2014-11-23 (×2): qty 150

## 2014-11-23 NOTE — Progress Notes (Addendum)
Patient ID: Allison Burgess, female   DOB: Sep 16, 1940, 74 y.o.   MRN: [id number]  [facility]  Subjective: Patient resting in bed in ICU, family at bedside.  Discussed with nurse.  Limited po intake.  Some pain along left side from hip to shoulder.  Increased pain with deep inspiration.  Objective: Vital signs in last 24 hours: Temp:  [97.7 F (36.5 C)-99 F (37.2 C)] 99 F (37.2 C) (10/13 0327) Pulse Rate:  [66-91] 91 (10/13 0700) Resp:  [15-23] 21 (10/13 0700) BP: (95-157)/(39-70) 157/66 mmHg (10/13 0600) SpO2:  [80 %-99 %] 80 % (10/13 0700) Last BM Date: 11/22/14  Intake/Output from previous day: 10/12 0701 - 10/13 0700 In: 850 [P.O.:300; IV Piggyback:550] Out: 950 [Urine:950] Intake/Output this shift: Total I/O In: 50 [IV Piggyback:50] Out: 600 [Urine:600]  Physical Exam: HEENT - sclerae clear, mucous membranes moist Neck - soft Chest - few rhonchi on left, shallow Cor - RRR Abdomen - protuberant, soft; active BS present; no mass; no tenderness; tender left flank Ext - no edema, non-tender Neuro - alert & oriented, no focal deficits  Lab Results:   Recent Labs  11/21/14 0343 11/22/14 0655  WBC 23.2* 9.9  HGB 6.8* 9.7*  HCT 21.8* 29.6*  PLT 46* 32*   BMET  Recent Labs  11/21/14 0343 11/22/14 0655  NA 135 136  K 4.1 3.2*  CL 109 103  CO2 22 26  GLUCOSE 98 87  BUN 8 8  CREATININE 0.79 0.90  CALCIUM 7.6* 7.6*   PT/INR No results for input(s): LABPROT, INR in the last 72 hours. Comprehensive Metabolic Panel:    Component Value Date/Time   NA 136 11/22/2014 0655   NA 135 11/21/2014 0343   K 3.2* 11/22/2014 0655   K 4.1 11/21/2014 0343   CL 103 11/22/2014 0655   CL 109 11/21/2014 0343   CO2 26 11/22/2014 0655   CO2 22 11/21/2014 0343   BUN 8 11/22/2014 0655   BUN 8 11/21/2014 0343   CREATININE 0.90 11/22/2014 0655   CREATININE 0.79 11/21/2014 0343   GLUCOSE 87 11/22/2014 0655   GLUCOSE 98 11/21/2014 0343    CALCIUM 7.6* 11/22/2014 0655   CALCIUM 7.6* 11/21/2014 0343   AST 22 11/21/2014 0343   AST 24 11/20/2014 0225   ALT 15 11/21/2014 0343   ALT 18 11/20/2014 0225   ALKPHOS 47 11/21/2014 0343   ALKPHOS 47 11/20/2014 0225   BILITOT 0.9 11/21/2014 0343   BILITOT 0.7 11/20/2014 0225   PROT 4.7* 11/21/2014 0343   PROT 4.9* 11/20/2014 0225   ALBUMIN 2.5* 11/21/2014 0343   ALBUMIN 2.8* 11/20/2014 0225    Studies/Results: US Abdomen Complete  11/22/2014  CLINICAL DATA:  Ascites. EXAM: ULTRASOUND ABDOMEN COMPLETE COMPARISON:  CT 11/20/2014. FINDINGS: Gallbladder: Multiple tiny approximately 3 mm gallbladder polyps. Gallbladder wall thickness normal at 1.1 mm. Negative Murphy sign. Common bile duct: Diameter: 7.6 mm Liver: No focal hepatic abnormality.  Hepatic echotexture normal. IVC: No abnormality visualized. Pancreas: Visualized portion unremarkable. Spleen: Spleen is enlarged 15 cm with a volume of 857 cubic cm. Right Kidney: Length: 11.9 cm. Echogenicity within normal limits. No mass or hydronephrosis visualized. 12 mm nonobstructing lower pole calyceal stone. Left Kidney: Length: 10.9 cm. Echogenicity within normal limits. No mass or hydronephrosis visualized. Abdominal aorta: No aneurysm visualized. Other findings: Mild ascites. IMPRESSION: 1.  Splenomegaly. 2 mild ascites. 3.  Nonobstructing left nephrolithiasis. Electronically Signed   By: Marcello Moores  Register   On:  11/22/2014 09:29   Dg Chest Port 1 View  11/22/2014  CLINICAL DATA:  Shortness of Breath EXAM: PORTABLE CHEST - 1 VIEW COMPARISON:  11/19/2014 FINDINGS: Cardiac shadow is stable. Increasing left basilar infiltrate is noted when compare with the prior exam. Diffuse interstitial changes are again identified. No bony abnormality is seen. Right chest wall port is again seen and stable. IMPRESSION: Increasing left basilar infiltrate. Electronically Signed   By: Inez Catalina M.D.   On: 11/22/2014 12:26    Anti-infectives: Anti-infectives     Start     Dose/Rate Route Frequency Ordered Stop   11/22/14 1800  vancomycin (VANCOCIN) IVPB 750 mg/150 ml premix     750 mg 150 mL/hr over 60 Minutes Intravenous Every 12 hours 11/22/14 0818     11/20/14 1600  vancomycin (VANCOCIN) 500 mg in sodium chloride 0.9 % 100 mL IVPB  Status:  Discontinued     500 mg 100 mL/hr over 60 Minutes Intravenous Every 24 hours 11/19/14 1524 11/19/14 2319   11/20/14 1600  vancomycin (VANCOCIN) 500 mg in sodium chloride 0.9 % 100 mL IVPB  Status:  Discontinued     500 mg 100 mL/hr over 60 Minutes Intravenous Every 24 hours 11/19/14 2331 11/20/14 0752   11/20/14 0800  vancomycin (VANCOCIN) 500 mg in sodium chloride 0.9 % 100 mL IVPB  Status:  Discontinued     500 mg 100 mL/hr over 60 Minutes Intravenous Every 12 hours 11/20/14 0752 11/22/14 0818   11/19/14 2359  piperacillin-tazobactam (ZOSYN) IVPB 3.375 g     3.375 g 12.5 mL/hr over 240 Minutes Intravenous Every 8 hours 11/19/14 2333     11/19/14 2330  piperacillin-tazobactam (ZOSYN) IVPB 3.375 g  Status:  Discontinued     3.375 g 100 mL/hr over 30 Minutes Intravenous  Once 11/19/14 2319 11/19/14 2332   11/19/14 2330  vancomycin (VANCOCIN) IVPB 1000 mg/200 mL premix  Status:  Discontinued     1,000 mg 200 mL/hr over 60 Minutes Intravenous  Once 11/19/14 2319 11/19/14 2331   11/19/14 1815  vancomycin (VANCOCIN) 50 mg/mL oral solution 125 mg  Status:  Discontinued     125 mg Oral 4 times per day 11/19/14 1806 11/21/14 0849   11/19/14 1530  vancomycin (VANCOCIN) IVPB 1000 mg/200 mL premix     1,000 mg 200 mL/hr over 60 Minutes Intravenous STAT 11/19/14 1516 11/19/14 1702   11/19/14 1430  piperacillin-tazobactam (ZOSYN) IVPB 3.375 g     3.375 g 100 mL/hr over 30 Minutes Intravenous  Once 11/19/14 1418 11/19/14 1559      Assessment & Plans: AML on chemotherapy  Thrombocytopenia likely secondary to recent chemo Rx Splenomegaly with possible small infarcts on CT scan  May be source of pain - should be  self-limited  Discussed with medical oncology here and agree no role for splenectomy in treatment of AML  No indication for splenectomy at this time Left pleural effusion / pneumonia  Most likely cause of left flank and shoulder pain and pleuritic symptoms  Rx per medical service  No acute surgical issues at present.  Will sign off.  Call if further surgical evaluation required.  Earnstine Regal, MD, [facility] Office: [place] 11/23/2014

## 2014-11-23 NOTE — Progress Notes (Addendum)
Date:  Oct. 13, 2016 U.R. performed for needs and level of care. Will continue to follow for Case Management needs.  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

## 2014-11-23 NOTE — Progress Notes (Signed)
ANTIBIOTIC CONSULT NOTE - Follow up  Pharmacy Consult for Levaquin Indication: Pneumonia  Allergies  Allergen Reactions  . Other     chlorhydrate - makes her hyper     Patient Measurements: Height: 5' (152.4 cm) Weight: 117 lb 15.1 oz (53.5 kg) IBW/kg (Calculated) : 45.5  Vital Signs: Temp: 98.5 F (36.9 C) (10/13 0800) Temp Source: Oral (10/13 0800) BP: 141/59 mmHg (10/13 0800) Pulse Rate: 91 (10/13 0700) Intake/Output from previous day: 10/12 0701 - 10/13 0700 In: 850 [P.O.:300; IV Piggyback:550] Out: 950 [Urine:950]  Labs:  Recent Labs  11/21/14 0343 11/22/14 0655  WBC 23.2* 9.9  HGB 6.8* 9.7*  PLT 46* 32*  CREATININE 0.79 0.90   Estimated Creatinine Clearance: 40 mL/min (by C-G formula based on Cr of 0.9).  Recent Labs  11/22/14 0655  VANCOTROUGH 11     Assessment: 37 yoF was brought to ED on 10/9 with N/V/D, confusion, and fever.  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  CXR (10/9) with possible pneumonia at left base.  Pharmacy is consulted to dose vancomycin IV and Zosyn for suspected sepsis.  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  10/9 >> Zosyn >> 10/13 10/9 >> Vanc IV >> 10/13 10/9 >> Vanc PO >> 10/11 10/13 >> Levaquin >>   Today, 11/23/2014:  Tm 99  WBC improved to WNL   SCr 0.9 with CrCl ~ 40 ml/min  Cultures remain NGTD  Goal of Therapy:  Appropriate abx dosing, eradication of infection.   Plan:   Levaquin 750mg  IV q48h  Follow up renal function, cultures, and clinical condition.  Change Levaquin to PO when tolerating oral intake and meds.  Gretta Arab PharmD, BCPS Pager [phone] 11/23/2014 10:47 AM

## 2014-11-23 NOTE — Progress Notes (Signed)
Patient ID: Allison Burgess, female   DOB: 1941/02/07, 74 y.o.   MRN: [id number] [facility] PROGRESS NOTE  Allison Burgess WIO:[id number] DOB: 24-May-1940 DOA: 11/19/2014 PCP: patient follows with oncology in [facility] Dr. Baird Cancer [phone])  Brief narrative:    74 y.o. female with past medical history of AML, on chemotherapy, hypertension, dyslipidemia who presented to ED with nausea, vomiting, diarrhea, left flank pain and confusion started 1-2 days prior to this admission. Patient also had fevers at home, T max 101 F. of note, patient has recently finished a course of Flagyl for C. difficile colitis. Last chemotherapy was about one week prior to this admission followed with Neulasta support.   On admission, patient briefly required pressor support with Levophed. CT renal stone protocol on the admission showed no acute abnormalities in the abdomen or pelvis but it was noted she has a non-obstructing bilateral renal calculi. Also seen were multiple uterine fibroids. CT abdomen pelvis showed slight increasing splenomegaly. She also has stable mild aortic dilatation at the aortic hiatus. Chest x-ray showed mild patchy density at the left base that could represent possible left base pneumonia. Sepsis criteria met on the admission for which reason patient placed on broad-spectrum abx, Vanco and Zosyn.  10/11 - transfused 2 U PRBC (irradiated blood) 10/12 - surgery consulted with no current recommendations for surgical intervention    Assessment/Plan:    Principal problem: Severe sepsis secondary to lobar pneumonia, unspecified organism / leukocytosis / Nausea, vomiting, diarrhea  - Sepsis criteria met on the admission, pneumonia suspected source of infection. - Patient initially on vancomycin and Zosyn. We will narrow down to Levaquin starting today. - Chest x-ray was done again 11/22/2014 and showed increasing left basilar infiltrate however her respiratory status this morning is much better  compared with yesterday. - Patient has gotten 1 dose of Lasix 40 mg IV on 11/21/2014 in between the transfusions. She has gotten another dose of Lasix 11/22/2014. - So far her blood cultures are negative. C. difficile is negative. Urine culture did not demonstrate significant growth. - Patient is hemodynamically stable, she does not require pressor support. - Nausea, vomiting and diarrhea have improved. - Transfer to telemetry floor today. Order placed.  Active problems: Left flank pain / splenomegaly / ascites  - CT renal stone protocol showed non-obstructing bilateral renal calculi. No evidence of pyelonephritis. Small amount of fluid near liver and spleen with splenomegaly and splenic infarcts.  - Appreciate very much surgery recommendations. At this time no role for surgical intervention. - Pain has improved over past 24 hours.  Acute encephalopathy - Secondary to sepsis.  - Her mental status continues to be good, she is oriented to time, place and person   Acute renal failure - Secondary to sepsis  - Patient has received IV fluids. Her creatinine normalized with hydration.  Hypokalemia - Likely secondary to Lasix. Potassium supplemented.  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 - Patient follows with oncology in [facility], Dr. Baird Cancer.  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 - Because of drop in hemoglobin to 6.8 on 11/21/2014 patient has received 2 units of PRBC transfusion. - Post-transfusion Hgb is 9.7 - CBC pending this morning.  Thrombocytopenia - Secondary to malignancy and sequela of chemotherapy. - Follow-up CBC this morning.  Dyslipidemia - Continue Lipitor   Essential hypertension - Continue metoprolol 12.5 mg PO  BID  Chemotherapy induced neuropathy - Continue gabapentin    Moderate protein calorie malnutrition - In the context of chronic illness - Liberalize the diet.  - Patient has been seen by dietitian and she can continue nutritional supplementation.  DVT Prophylaxis  - SCDs bilaterally due to thrombocytopenia   Code Status: Full.  Family Communication:  plan of care discussed with the patient and her husband and son at the bedside Disposition Plan: Transfer to medical floor today.  IV access:  Peripheral IV  Procedures and diagnostic studies:    Ct Abdomen Pelvis W Contrast 11/20/2014   Slight increasing splenomegaly. Peripheral areas of low density noted which could reflect areas of infarct.  New small bilateral pleural effusions and bibasilar atelectasis, left greater than right.  Small amount of ascites around the liver, spleen and in the cul-de-sac.  Fibroid uterus.  Mild aortic dilatation at the aortic hiatus, 3.7 cm, stable.   Electronically Signed   By: Rolm Baptise M.D.   On: 11/20/2014 15:43   Dg Chest Port 1 View 11/19/2014 Rotated towards the left. Mild patchy density at the left base that could represent atelectasis or mild left base pneumonia.   Electronically Signed   By: Nelson Chimes M.D.   On: 11/19/2014 15:43   Ct Renal Stone Study 11/19/2014  1. Small amount of ascites in the upper abdomen. 2. No acute abnormalities otherwise involving the abdomen or pelvis. 3. 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 Bilobed infrarenal abdominal aortic aneurysm with maximum diameter 3.0 cm. 4. Nonobstructing bilateral renal calculi including a large (1.5 cm) calculus in the lower pole of the right kidney. 5. Approximate 8 mm calculus in the dependent portion of the distended urinary bladder. 6. Splenomegaly without evidence of focal splenic parenchymal abnormality. 7. Multiple uterine fibroids. What I believe is a degenerated, liquified approximate 3 cm fibroid arises from the left posterior lower uterine segment.   Electronically  Signed   By: Evangeline Dakin M.D.   On: 11/19/2014 17:25   Medical Consultants:  Surgery  Oncology, Dr. Baird Cancer, phone call only to inform him of patient's admission.  Other Consultants:  Physical therapy Nutrition  IAnti-Infectives:   Zosyn and vancomycin 11/20/2014 --> 10/13 Levaquin 10/13 -->   DEVINE, Dedra Skeens, MD  [facility] Pager [phone]  Time spent in minutes: 25 minutes  If 7PM-7AM, please contact night-coverage [email or web address] Password [email or web address] 11/23/2014, 7:13 AM   LOS: 4 days    HPI/Subjective: No acute overnight events. Patient reports feeling better this morning.  Objective: Filed Vitals:   11/23/14 0200 11/23/14 0327 11/23/14 0400 11/23/14 0600  BP: 126/51  133/58 157/66  Pulse: 74  78 76  Temp:  99 F (37.2 C)    TempSrc:  Oral    Resp: 19  15 16   Height:      Weight:      SpO2: 95%  95% 94%    Intake/Output Summary (Last 24 hours) at 11/23/14 0713 Last data filed at 11/23/14 0600  Gross per 24 hour  Intake    850 ml  Output    950 ml  Net   -100 ml    Exam:   General:  Pt is alert, awake,  in no acute distress  Cardiovascular: Regular rhythm, appreciate S1-S2  Respiratory: Bilateral air entry, no wheezing  Abdomen: Tender to palpation on left side but no guarding or rebound tenderness, appreciate bowel sounds  Extremities: No edema, bilateral pulses appreciated  Neuro: No focal deficits  Data Reviewed: Basic Metabolic Panel:  Recent Labs Lab 11/19/14 1425 11/19/14 1459 11/20/14 0225 11/21/14 0343 11/22/14 0655  NA 135 133* 140 135 136  K 3.9 4.1 3.6 4.1 3.2*  CL 101 97* 111 109 103  CO2 25  --  22 22 26   GLUCOSE 120* 121* 103* 98 87  BUN 16 15 9 8 8   CREATININE 1.02* 1.20* 0.80 0.79 0.90  CALCIUM 8.1*  --  7.3* 7.6* 7.6*   Liver Function Tests:  Recent Labs Lab 11/19/14 1425 11/20/14 0225 11/21/14 0343  AST 29 24 22   ALT 19 18 15   ALKPHOS 49 47 47  BILITOT 0.8 0.7 0.9  PROT 4.9* 4.9* 4.7*  ALBUMIN  2.8* 2.8* 2.5*   No results for input(s): LIPASE, AMYLASE in the last 168 hours. No results for input(s): AMMONIA in the last 168 hours. CBC:  Recent Labs Lab 11/19/14 1425 11/19/14 1459 11/20/14 0225 11/21/14 0343 11/22/14 0655  WBC 40.7*  --  33.4* 23.2* 9.9  NEUTROABS 33.8*  --  28.7*  --   --   HGB 7.0* 7.8* 7.4* 6.8* 9.7*  HCT 22.2* 23.0* 24.4* 21.8* 29.6*  MCV 93.7  --  95.3 94.4 87.8  PLT 61*  --  48* 46* 32*   Cardiac Enzymes: No results for input(s): CKTOTAL, CKMB, CKMBINDEX, TROPONINI in the last 168 hours. BNP: Invalid input(s): POCBNP CBG: No results for input(s): GLUCAP in the last 168 hours.  Recent Results (from the past 240 hour(s))  Blood Culture (routine x 2)     Status: None (Preliminary result)   Collection Time: 11/19/14  2:37 PM  Result Value Ref Range Status   Specimen Description BLOOD BLOOD RIGHT FOREARM  Final   Special Requests BOTTLES DRAWN AEROBIC AND ANAEROBIC 5 CC EACH  Final   Culture   Final    NO GROWTH < 24 HOURS Performed at [facility]    Report Status PENDING  Incomplete  Blood Culture (routine x 2)     Status: None (Preliminary result)   Collection Time: 11/19/14  2:48 PM  Result Value Ref Range Status   Specimen Description BLOOD RIGHT HAND  Final   Special Requests BOTTLES DRAWN AEROBIC AND ANAEROBIC 5 CC EACH  Final   Culture   Final    NO GROWTH < 24 HOURS Performed at [facility]    Report Status PENDING  Incomplete  Urine culture     Status: None   Collection Time: 11/19/14  4:40 PM  Result Value Ref Range Status   Specimen Description URINE, CLEAN CATCH  Final   Special Requests NONE  Final   Culture   Final    4,000 COLONIES/mL INSIGNIFICANT GROWTH Performed at [facility]    Report Status 11/20/2014 FINAL  Final  MRSA PCR Screening     Status: None   Collection Time: 11/19/14 10:03 PM  Result Value Ref Range Status   MRSA by PCR NEGATIVE NEGATIVE Final  C difficile quick scan w PCR  reflex     Status: None   Collection Time: 11/20/14 11:43 AM  Result Value Ref Range Status   C Diff antigen NEGATIVE NEGATIVE Final   C Diff toxin NEGATIVE NEGATIVE Final   C Diff interpretation Negative for toxigenic C. difficile  Final     Scheduled Meds: . antiseptic oral rinse  7 mL Mouth Rinse BID  . atorvastatin  10 mg Oral Daily  . dextromethorphan-guaiFENesin  1 tablet Oral BID  .  gabapentin  400 mg Oral TID  . lactose free nutrition  237 mL Oral BID BM  . metoprolol tartrate  12.5 mg Oral BID  . piperacillin-tazobactam (ZOSYN)  IV  3.375 g Intravenous Q8H  . vancomycin  750 mg Intravenous Q12H  . zolpidem  5 mg Oral QHS   Continuous Infusions:

## 2014-11-24 LAB — BASIC METABOLIC PANEL
ANION GAP: 5 (ref 5–15)
BUN: 7 mg/dL (ref 6–20)
CHLORIDE: 99 mmol/L — AB (ref 101–111)
CO2: 32 mmol/L (ref 22–32)
Calcium: 7.9 mg/dL — ABNORMAL LOW (ref 8.9–10.3)
Creatinine, Ser: 0.77 mg/dL (ref 0.44–1.00)
GFR calc non Af Amer: >60 mL/min (ref >60)
Glucose, Bld: 95 mg/dL (ref 65–99)
POTASSIUM: 3.6 mmol/L (ref 3.5–5.1)
SODIUM: 136 mmol/L (ref 135–145)

## 2014-11-24 LAB — CULTURE, BLOOD (ROUTINE X 2)
CULTURE: NO GROWTH
Culture: NO GROWTH

## 2014-11-24 LAB — CBC
HEMATOCRIT: 26.5 % — AB (ref 36.0–46.0)
HEMOGLOBIN: 8.7 g/dL — AB (ref 12.0–15.0)
MCH: 29.1 pg (ref 26.0–34.0)
MCHC: 32.8 g/dL (ref 30.0–36.0)
MCV: 88.6 fL (ref 78.0–100.0)
PLATELETS: 33 10*3/uL — AB (ref 150–400)
RBC: 2.99 MIL/uL — AB (ref 3.87–5.11)
RDW: 19.5 % — ABNORMAL HIGH (ref 11.5–15.5)
WBC: 7.2 10*3/uL (ref 4.0–10.5)

## 2014-11-24 MED ORDER — POTASSIUM CHLORIDE CRYS ER 20 MEQ PO TBCR
40.0000 meq | EXTENDED_RELEASE_TABLET | Freq: Once | ORAL | Status: AC
  Administered 2014-11-24: 40 meq via ORAL
  Filled 2014-11-24: qty 2

## 2014-11-24 NOTE — Care Management Important Message (Signed)
Important Message  Patient Details  Name: Shiasia Porro MRN: [id number] Date of Birth: [birth year]   Medicare Important Message Given:  Yes-second notification given    Camillo Flaming 11/24/2014, 1:06 [place] Message  Patient Details  Name: Franziska Podgurski MRN: [id number] Date of Birth: 1940/03/26   Medicare Important Message Given:  Yes-second notification given    Camillo Flaming 11/24/2014, 1:06 PM

## 2014-11-24 NOTE — Progress Notes (Signed)
Physical Therapy Treatment Patient Details Name: Allison Burgess MRN: [id number] DOB: 1940-09-25 Today's Date: 11/24/2014    History of Present Illness Pt admitted with dx sepsis 2* PNA and pleural effusion on L.  Pt with hx of COPD and myeloblastic leukemia    PT Comments    Pt in bed on 2 lts O2 sats 96%.  With increased time, assisted OOB to [facility] to void.  Then amb a greater distance in hallway.  Pt declined the RW and preferred to hold to her visitor on one side then IV on other.  Mils unsteady gait.    Follow Up Recommendations  Home health PT;No PT follow up (pending progress and pt input)     Equipment Recommendations       Recommendations for Other Services       Precautions / Restrictions Precautions Precautions: Fall Precaution Comments: monitor O2 Restrictions Weight Bearing Restrictions: No    Mobility  Bed Mobility Overal bed mobility: Needs Assistance Bed Mobility: Supine to Sit     Supine to sit: Supervision;Min guard     General bed mobility comments: Increased time  Transfers Overall transfer level: Needs assistance Equipment used: None Transfers: Sit to/from Stand Sit to Stand: Min guard         General transfer comment: cues for saftey, transition position and use of UEs to self assist  Ambulation/Gait Ambulation/Gait assistance: Min guard;Min assist Ambulation Distance (Feet): 125 Feet Assistive device: 2 person hand held assist Gait Pattern/deviations: Step-through pattern;Decreased stride length;Trunk flexed Gait velocity: decr   General Gait Details: cues for pacing, posture and BOS.  HHA x 2 for stability - pt declines to attempt RW.  Avg HR 87 and O2 sats on 2 lts 96%   Stairs            Wheelchair Mobility    Modified Rankin (Stroke Patients Only)       Balance                                    Cognition Arousal/Alertness: Awake/alert Behavior During Therapy: WFL for tasks  assessed/performed Overall Cognitive Status: Within Functional Limits for tasks assessed                      Exercises      General Comments        Pertinent Vitals/Pain Pain Assessment: No/denies pain    Home Living                      Prior Function            PT Goals (current goals can now be found in the care plan section) Progress towards PT goals: Progressing toward goals    Frequency  Min 3X/week    PT Plan      Co-evaluation             End of Session Equipment Utilized During Treatment: Gait belt Activity Tolerance: Patient tolerated treatment well Patient left: in chair;with call bell/phone within reach     Time: 1115-1145 PT Time Calculation (min) (ACUTE ONLY): 30 min  Charges:  $Gait Training: 8-22 mins $Therapeutic Activity: 8-22 mins                    G Codes:      Rica Koyanagi  PTA [facility]  Acute  Rehab Pager  [phone]  

## 2014-11-24 NOTE — Progress Notes (Signed)
Vital Sign Min/Max (last 24 hours)    Value Min Max   Temp 98.2 F (36.8 C) 99.5 F (37.5 C)   Pulse Rate 74 94   Resp 20 20   BP: Systolic 89 mmHg 011 mmHg   BP: Diastolic 48 mmHg 67 mmHg   SpO2 92 % 97 %       Date: November 27, 2014 Chart reviewed for concurrent status and case management needs. Will continue to follow patient for changes and needs: Velva Harman, RN, BSN, [place]   [phone]

## 2014-11-24 NOTE — Progress Notes (Signed)
Patient ID: Allison Burgess, female   DOB: Mar 12, 1940, 74 y.o.   MRN: [id number] [facility] PROGRESS NOTE  Allison Burgess EVO:[id number] DOB: 1940-05-15 DOA: 11/19/2014 PCP: patient follows with oncology in [facility] Dr. Baird Cancer [phone])  Brief narrative:    74 y.o. female with past medical history of AML, on chemotherapy, hypertension, dyslipidemia who presented to ED with nausea, vomiting, diarrhea, left flank pain and confusion started 1-2 days prior to this admission. Patient also had fevers at home, T max 101 F. of note, patient has recently finished a course of Flagyl for C. difficile colitis. Last chemotherapy was about one week prior to this admission followed with Neulasta support.   On admission, patient briefly required pressor support with Levophed. CT renal stone protocol on the admission showed no acute abnormalities in the abdomen or pelvis but it was noted she has a non-obstructing bilateral renal calculi. Also seen were multiple uterine fibroids. CT abdomen pelvis showed slight increasing splenomegaly. She also has stable mild aortic dilatation at the aortic hiatus. Chest x-ray showed mild patchy density at the left base that could represent possible left base pneumonia. Sepsis criteria met on the admission for which reason patient placed on broad-spectrum abx, Vanco and Zosyn.  10/11 - transfused 2 U PRBC (irradiated blood) 10/12 - surgery consulted with no current recommendations for surgical intervention   Anticipated discharge: 11/27/2014 if platelet count remains stable.   Assessment/Plan:    Principal problem: Severe sepsis secondary to lobar pneumonia, unspecified organism / leukocytosis / Nausea, vomiting, diarrhea  - Sepsis criteria met on the admission. Suspected source of infection is pneumonia. Blood cultures so far show no growth. C. difficile is negative. Urine culture did not demonstrate significant growth. - Patient initially on vancomycin and Zosyn.  Currently on Levaquin which was started 11/23/2014. - Her last chest x-ray was 11/22/2014 with increasing left basilar infiltrate. Respiratory status however remain stable. Obtain chest x-ray tomorrow morning. - Of note, patient has received Lasix 40 mg IV 11/21/2014 in between the transfusion in addition to one dose of Lasix 11/22/2014. -Patient initially in step down unit, transferred to telemetry floor 11/23/2014.   Active problems: Left flank pain / splenomegaly / ascites  - As previously noted, CT renal stone protocol showed non-obstructing bilateral renal calculi. No evidence of pyelonephritis. Small amount of fluid near liver and spleen with splenomegaly and splenic infarcts.  - Per surgery, splenectomy not indicated.  Acute encephalopathy - Secondary to sepsis.  - Mental status great.  Acute renal failure - Secondary to sepsis  - creatinine normalized with fluids   Hypokalemia - Likely secondary to Lasix. Potassium supplemented. - Potassium within normal limits this morning.   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 - Patient follows with oncology in [facility], Dr. Baird Cancer. He was informed of patient's admission.   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 - Patient is status post 2 units of PRBC transfusion on 11/21/2014. - Hemoglobin is 8.7 this morning. Continue to monitor daily CBC.  Thrombocytopenia - Secondary to malignancy and sequela of chemotherapy. - platelet count 33 this morning. Continue to monitor daily CBC.  - No reports of bleeding.  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  Family Communication:  plan of care discussed with the patient and her husband and son at the bedside Disposition Plan: Home likely by 11/27/2014 if platelet count remains stable.  IV access:  Peripheral IV PAC  Procedures and diagnostic studies:    Dg Chest Port 1 View 2014/12/02  Increasing left basilar infiltrate. Electronically Signed   By: Inez Catalina M.D.   On: 12-02-14 12:26   Ct Abdomen Pelvis W Contrast 11/20/2014   Slight increasing splenomegaly. Peripheral areas of low density noted which could reflect areas of infarct.  New small bilateral pleural effusions and bibasilar atelectasis, left greater than right.  Small amount of ascites around the liver, spleen and in the cul-de-sac.  Fibroid uterus.  Mild aortic dilatation at the aortic hiatus, 3.7 cm, stable.   Electronically Signed   By: Rolm Baptise M.D.   On: 11/20/2014 15:43   Dg Chest Port 1 View 11/19/2014 Rotated towards the left. Mild patchy density at the left base that could represent atelectasis or mild left base pneumonia.   Electronically Signed   By: Nelson Chimes M.D.   On: 11/19/2014 15:43   Ct Renal Stone Study 11/19/2014  1. Small amount of ascites in the upper abdomen. 2. No acute abnormalities otherwise involving the abdomen or pelvis. 3. 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 Bilobed infrarenal abdominal aortic aneurysm with maximum diameter 3.0 cm. 4. Nonobstructing bilateral renal calculi including a large (1.5 cm) calculus in the lower pole of the right kidney. 5. Approximate 8 mm calculus in the dependent portion of the distended urinary bladder. 6. Splenomegaly without evidence of focal splenic parenchymal abnormality. 7. Multiple uterine fibroids. What I believe is a degenerated, liquified approximate 3 cm fibroid arises from the left posterior lower uterine segment.   Electronically Signed    By: Evangeline Dakin M.D.   On: 11/19/2014 17:25   Medical Consultants:  Surgery, Dr. Armandina Gemma  Oncology, Dr. Baird Cancer, phone call only to inform him of patient's admission.  Other Consultants:  Physical therapy Nutrition  IAnti-Infectives:   Zosyn and vancomycin 11/20/2014 --> 10/13 Levaquin 10/13 -->   Altovise Wahler, Dedra Skeens, MD  [facility] Pager [phone]  Time spent in minutes: 25 minutes  If 7PM-7AM, please contact night-coverage [email or web address] Password [email or web address] 11/24/2014, 10:44 AM   LOS: 5 days    HPI/Subjective: No acute overnight events. Patient reports no shortness of breath.  Objective: Filed Vitals:   11/23/14 1200 11/23/14 1409 11/23/14 2243 11/24/14 0531  BP:  89/67 116/48 127/50  Pulse:  82 74 94  Temp: 98.5 F (36.9 C) 98.2 F (36.8 C) 98.4 F (36.9 C) 99.5 F (37.5 C)  TempSrc: Oral Oral Oral Oral  Resp:  _0 Height:      Weight:      SpO2:  97% 94% 92%    Intake/Output Summary (Last 24 hours) at 11/24/14 1044 Last data filed at 11/24/14 1006  Gross per 24 hour  Intake    390 ml  Output    200 ml  Net    190 ml    Exam:   General:  Pt is alert, no acute distress  Cardiovascular: Rate controlled, S1-S2 appreciated  Respiratory: No wheezing, bilateral air entry  Abdomen: Still tender on left side of the abdomen, no guarding or rebound tenderness, appreciate bowel  sounds  Extremities: No leg swelling, pulses palpable  Neuro: Nonfocal  Data Reviewed: Basic Metabolic Panel:  Recent Labs Lab 11/20/14 0225 11/21/14 0343 11/22/14 0655 11/23/14 1248 11/24/14 0725  NA 140 135 136 134* 136  K 3.6 4.1 3.2* 3.4* 3.6  CL 111 109 103 97* 99*  CO2 _0 32 32  GLUCOSE 103* 98 87 114* 95  BUN _1 CREATININE 0.80 0.79 0.90 0.72 0.77  CALCIUM 7.3* 7.6* 7.6* 7.7* 7.9*   Liver Function Tests:  Recent Labs Lab 11/19/14 1425 11/20/14 0225 11/21/14 0343  AST _2 ALT _3 ALKPHOS 49 47 47  BILITOT 0.8  0.7 0.9  PROT 4.9* 4.9* 4.7*  ALBUMIN 2.8* 2.8* 2.5*   No results for input(s): LIPASE, AMYLASE in the last 168 hours. No results for input(s): AMMONIA in the last 168 hours. CBC:  Recent Labs Lab 11/19/14 1425  11/20/14 0225 11/21/14 0343 11/22/14 0655 11/23/14 1248 11/24/14 0725  WBC 40.7*  --  33.4* 23.2* 9.9 7.6 7.2  NEUTROABS 33.8*  --  28.7*  --   --   --   --   HGB 7.0*  < > 7.4* 6.8* 9.7* 8.9* 8.7*  HCT 22.2*  < > 24.4* 21.8* 29.6* 27.9* 26.5*  MCV 93.7  --  95.3 94.4 87.8 88.3 88.6  PLT 61*  --  48* 46* 32* 38* 33*  < > = values in this interval not displayed. Cardiac Enzymes: No results for input(s): CKTOTAL, CKMB, CKMBINDEX, TROPONINI in the last 168 hours. BNP: Invalid input(s): POCBNP CBG: No results for input(s): GLUCAP in the last 168 hours.  Recent Results (from the past 240 hour(s))  Blood Culture (routine x 2)     Status: None (Preliminary result)   Collection Time: 11/19/14  2:37 PM  Result Value Ref Range Status   Specimen Description BLOOD BLOOD RIGHT FOREARM  Final   Special Requests BOTTLES DRAWN AEROBIC AND ANAEROBIC 5 CC EACH  Final   Culture   Final    NO GROWTH < 24 HOURS Performed at [facility]    Report Status PENDING  Incomplete  Blood Culture (routine x 2)     Status: None (Preliminary result)   Collection Time: 11/19/14  2:48 PM  Result Value Ref Range Status   Specimen Description BLOOD RIGHT HAND  Final   Special Requests BOTTLES DRAWN AEROBIC AND ANAEROBIC 5 CC EACH  Final   Culture   Final    NO GROWTH < 24 HOURS Performed at [facility]    Report Status PENDING  Incomplete  Urine culture     Status: None   Collection Time: 11/19/14  4:40 PM  Result Value Ref Range Status   Specimen Description URINE, CLEAN CATCH  Final   Special Requests NONE  Final   Culture   Final    4,000 COLONIES/mL INSIGNIFICANT GROWTH Performed at [facility]    Report Status 11/20/2014 FINAL  Final  MRSA PCR Screening      Status: None   Collection Time: 11/19/14 10:03 PM  Result Value Ref Range Status   MRSA by PCR NEGATIVE NEGATIVE Final  C difficile quick scan w PCR reflex     Status: None   Collection Time: 11/20/14 11:43 AM  Result Value Ref Range Status   C Diff antigen NEGATIVE NEGATIVE Final   C Diff toxin NEGATIVE NEGATIVE Final   C Diff interpretation Negative  for toxigenic C. difficile  Final     Scheduled Meds: . antiseptic oral rinse  7 mL Mouth Rinse BID  . atorvastatin  10 mg Oral Daily  . dextromethorphan-guaiFENesin  1 tablet Oral BID  . diphenhydrAMINE  25 mg Oral QHS  . gabapentin  400 mg Oral TID  . lactose free nutrition  237 mL Oral BID BM  . levofloxacin (LEVAQUIN) IV  750 mg Intravenous Q48H  . metoprolol tartrate  12.5 mg Oral BID  . sodium chloride  10-40 mL Intracatheter Q12H   Continuous Infusions:

## 2014-11-25 ENCOUNTER — Inpatient Hospital Stay (HOSPITAL_COMMUNITY): Payer: Medicare Other

## 2014-11-25 LAB — CBC
HEMATOCRIT: 26.5 % — AB (ref 36.0–46.0)
HEMOGLOBIN: 8.3 g/dL — AB (ref 12.0–15.0)
MCH: 27.9 pg (ref 26.0–34.0)
MCHC: 31.3 g/dL (ref 30.0–36.0)
MCV: 88.9 fL (ref 78.0–100.0)
Platelets: 35 10*3/uL — ABNORMAL LOW (ref 150–400)
RBC: 2.98 MIL/uL — AB (ref 3.87–5.11)
RDW: 19.1 % — ABNORMAL HIGH (ref 11.5–15.5)
WBC: 8.1 10*3/uL (ref 4.0–10.5)

## 2014-11-25 MED ORDER — CEFTRIAXONE SODIUM 1 G IJ SOLR
1.0000 g | INTRAMUSCULAR | Status: DC
  Administered 2014-11-25 – 2014-11-28 (×4): 1 g via INTRAVENOUS
  Filled 2014-11-25 (×5): qty 10

## 2014-11-25 MED ORDER — DEXTROSE 5 % IV SOLN
500.0000 mg | INTRAVENOUS | Status: DC
  Administered 2014-11-25 – 2014-11-28 (×4): 500 mg via INTRAVENOUS
  Filled 2014-11-25 (×5): qty 500

## 2014-11-25 MED ORDER — HYDROMORPHONE HCL 1 MG/ML IJ SOLN
1.0000 mg | INTRAMUSCULAR | Status: DC | PRN
  Administered 2014-11-25 – 2014-11-26 (×5): 1 mg via INTRAVENOUS
  Administered 2014-11-26 (×3): 2 mg via INTRAVENOUS
  Administered 2014-11-26 – 2014-11-27 (×2): 1 mg via INTRAVENOUS
  Administered 2014-11-27: 2 mg via INTRAVENOUS
  Administered 2014-11-27: 1 mg via INTRAVENOUS
  Administered 2014-11-27 – 2014-11-28 (×6): 2 mg via INTRAVENOUS
  Filled 2014-11-25 (×3): qty 1
  Filled 2014-11-25 (×3): qty 2
  Filled 2014-11-25 (×4): qty 1
  Filled 2014-11-25 (×6): qty 2
  Filled 2014-11-25: qty 1
  Filled 2014-11-25: qty 2

## 2014-11-25 NOTE — Progress Notes (Signed)
Patient ID: Allison Burgess, female   DOB: 1940/10/28, 74 y.o.   MRN: [id number] [facility] PROGRESS NOTE  Alyssamae Klinck YVO:[id number] DOB: [birth year] DOA: 11/19/2014 PCP: patient follows with oncology in [facility] Dr. Baird Cancer [phone])  Brief narrative:    74 y.o. female with past medical history of AML, on chemotherapy, hypertension, dyslipidemia who presented to ED with nausea, vomiting, diarrhea, left flank pain and confusion started 1-2 days prior to this admission. Patient also had fevers at home, T max 101 F. of note, patient has recently finished a course of Flagyl for C. difficile colitis. Last chemotherapy was about one week prior to this admission followed with Neulasta support.   On admission, patient briefly required pressor support with Levophed. CT renal stone protocol on the admission showed no acute abnormalities in the abdomen or pelvis but it was noted she has a non-obstructing bilateral renal calculi. Also seen were multiple uterine fibroids. CT abdomen pelvis showed slight increasing splenomegaly. She also has stable mild aortic dilatation at the aortic hiatus. Chest x-ray showed mild patchy density at the left base that could represent possible left base pneumonia. Sepsis criteria met on the admission for which reason patient placed on broad-spectrum abx, Vanco and Zosyn.  10/11 - transfused 2 U PRBC (irradiated blood) 10/12 - surgery consulted with no current recommendations for surgical intervention   Anticipated discharge: 11/27/2014 if platelet count remains stable.   Assessment/Plan:    Principal problem: Severe sepsis secondary to lobar pneumonia, unspecified organism / leukocytosis / Nausea, vomiting, diarrhea  - Sepsis criteria met on the admission. Suspected source of infection - pneumonia. Blood cultures so far show no growth. C. difficile is negative. Urine culture showed no growth. - Patient initially in step down unit, transferred to telemetry floor  11/23/2014. - Patient initially on vancomycin and Zosyn. Currently on Levaquin which was started 11/23/2014. - Her repeat CXR demonstrates persistent airspace disease on left and developing right base airspace disease. Will change abx to azithro and rocephin - Of note, patient has received Lasix 40 mg IV 11/21/2014 in between the transfusion in addition to one dose of Lasix 11/22/2014.   Active problems: Left flank pain / splenomegaly / ascites  - on admission, CT renal stone protocol showed non-obstructing bilateral renal calculi. No evidence of pyelonephritis. Small amount of fluid near liver and spleen with splenomegaly and splenic infarcts.  - Per surgery, splenectomy not indicated.  Acute encephalopathy - improved with resolving sepsis.  Acute renal failure - Secondary to sepsis  - Resolved with sepsis  Hypokalemia - Secondary to Lasix. Potassium supplemented.  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 - Patient follows with oncology in [facility], Dr. Baird Cancer. He was informed of patient's admission.   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 - Patient is status post 2 units of PRBC transfusion on 11/21/2014. - Hemoglobin remains stable   Thrombocytopenia - Secondary to malignancy and sequela of chemotherapy. - Platelets stable in 30/s range   Dyslipidemia - Continue Lipitor   Essential hypertension - Continue metoprolol 12.5 mg PO BID  Chemotherapy induced neuropathy - Continue gabapentin   Moderate protein calorie malnutrition - In the context of chronic illness, malignancy  - Diet as tolerated   DVT Prophylaxis  - SCD's bilaterally due to thrombocytopenia   Code Status: Full.  Family Communication:  plan of care discussed with the  patient and her husband and son at the bedside Disposition  Plan: Home likely by 11/27/2014 if platelet count remains stable.  IV access:  Peripheral IV PAC  Procedures and diagnostic studies:    Dg Chest Port 1 View 2014/12/08  Persistent left pleural effusion with adjacent airspace disease, suspicious for infection. Cardiomegaly with increased pulmonary interstitial prominence suspicious for pulmonary venous congestion. Developing right base Airspace disease, likely atelectasis. Electronically Signed   By: Abigail Miyamoto M.D.   On: 12/08/2014 10:31   Dg Chest Port 1 View 11/22/2014  Increasing left basilar infiltrate. Electronically Signed   By: Inez Catalina M.D.   On: 11/22/2014 12:26   Ct Abdomen Pelvis W Contrast 11/20/2014   Slight increasing splenomegaly. Peripheral areas of low density noted which could reflect areas of infarct.  New small bilateral pleural effusions and bibasilar atelectasis, left greater than right.  Small amount of ascites around the liver, spleen and in the cul-de-sac.  Fibroid uterus.  Mild aortic dilatation at the aortic hiatus, 3.7 cm, stable.   Electronically Signed   By: Rolm Baptise M.D.   On: 11/20/2014 15:43   Dg Chest Port 1 View 11/19/2014 Rotated towards the left. Mild patchy density at the left base that could represent atelectasis or mild left base pneumonia.   Electronically Signed   By: Nelson Chimes M.D.   On: 11/19/2014 15:43   Ct Renal Stone Study 11/19/2014  1. Small amount of ascites in the upper abdomen. 2. No acute abnormalities otherwise involving the abdomen or pelvis. 3. 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 Bilobed infrarenal abdominal aortic aneurysm with maximum diameter 3.0 cm. 4. Nonobstructing bilateral renal calculi including a large (1.5 cm) calculus in the lower pole of the right kidney. 5. Approximate 8 mm calculus in the dependent portion of the distended urinary bladder. 6. Splenomegaly without evidence of focal splenic parenchymal abnormality. 7. Multiple uterine  fibroids. What I believe is a degenerated, liquified approximate 3 cm fibroid arises from the left posterior lower uterine segment.   Electronically Signed   By: Evangeline Dakin M.D.   On: 11/19/2014 17:25   Medical Consultants:  Surgery, Dr. Armandina Gemma  Oncology, Dr. Baird Cancer, phone call only to inform him of patient's admission.  Other Consultants:  Physical therapy Nutrition  IAnti-Infectives:   Zosyn and vancomycin 11/20/2014 --> 10/13 Levaquin 10/13 --> 08-Dec-2022 Azithromycin and rocephin Dec 08, 2022 -->   Dhyan Noah, MD  [facility] Pager [phone]  Time spent in minutes: 25 minutes  If 7PM-7AM, please contact night-coverage [email or web address] Password [email or web address] 12/08/2014, 12:00 PM   LOS: 6 days    HPI/Subjective: No acute overnight events. Patient reports pain 8/10 but improved with pain meds.   Objective: Filed Vitals:   11/24/14 1400 11/24/14 2131 2014/12/08 0456 12/08/2014 0552  BP: 135/58 135/56 134/54 115/51  Pulse: 74 85 72 74  Temp: 97.4 F (36.3 C) 99.4 F (37.4 C) 98.3 F (36.8 C) 98.2 F (36.8 C)  TempSrc: Oral Oral Oral Oral  Resp: _0 Height:      Weight:      SpO2: 100% 99%  99%    Intake/Output Summary (Last 24 hours) at 08-Dec-2014 1200 Last data filed at 12/08/2014 1024  Gross per 24 hour  Intake    240 ml  Output      0 ml  Net    240 ml    Exam:   General:  Pt is alert, awake  Cardiovascular:  RRR, (+) S1, S2   Respiratory: no rhonchi, no wheezing   Abdomen: Left side abd tender, no rebound, (+) BS  Extremities: No edema, palpable pulses   Neuro: No focal deficits   Data Reviewed: Basic Metabolic Panel:  Recent Labs Lab 11/20/14 0225 11/21/14 0343 11/22/14 0655 11/23/14 1248 11/24/14 0725  NA 140 135 136 134* 136  K 3.6 4.1 3.2* 3.4* 3.6  CL 111 109 103 97* 99*  CO2 _0 32 32  GLUCOSE 103* 98 87 114* 95  BUN _1 CREATININE 0.80 0.79 0.90 0.72 0.77  CALCIUM 7.3* 7.6* 7.6* 7.7* 7.9*   Liver Function  Tests:  Recent Labs Lab 11/19/14 1425 11/20/14 0225 11/21/14 0343  AST _2 ALT _3 ALKPHOS 49 47 47  BILITOT 0.8 0.7 0.9  PROT 4.9* 4.9* 4.7*  ALBUMIN 2.8* 2.8* 2.5*   No results for input(s): LIPASE, AMYLASE in the last 168 hours. No results for input(s): AMMONIA in the last 168 hours. CBC:  Recent Labs Lab 11/19/14 1425  11/20/14 0225 11/21/14 0343 11/22/14 0655 11/23/14 1248 11/24/14 0725 11/25/14 0800  WBC 40.7*  --  33.4* 23.2* 9.9 7.6 7.2 8.1  NEUTROABS 33.8*  --  28.7*  --   --   --   --   --   HGB 7.0*  < > 7.4* 6.8* 9.7* 8.9* 8.7* 8.3*  HCT 22.2*  < > 24.4* 21.8* 29.6* 27.9* 26.5* 26.5*  MCV 93.7  --  95.3 94.4 87.8 88.3 88.6 88.9  PLT 61*  --  48* 46* 32* 38* 33* 35*  < > = values in this interval not displayed. Cardiac Enzymes: No results for input(s): CKTOTAL, CKMB, CKMBINDEX, TROPONINI in the last 168 hours. BNP: Invalid input(s): POCBNP CBG: No results for input(s): GLUCAP in the last 168 hours.  Recent Results (from the past 240 hour(s))  Blood Culture (routine x 2)     Status: None (Preliminary result)   Collection Time: 11/19/14  2:37 PM  Result Value Ref Range Status   Specimen Description BLOOD BLOOD RIGHT FOREARM  Final   Special Requests BOTTLES DRAWN AEROBIC AND ANAEROBIC 5 CC EACH  Final   Culture   Final    NO GROWTH < 24 HOURS Performed at [facility]    Report Status PENDING  Incomplete  Blood Culture (routine x 2)     Status: None (Preliminary result)   Collection Time: 11/19/14  2:48 PM  Result Value Ref Range Status   Specimen Description BLOOD RIGHT HAND  Final   Special Requests BOTTLES DRAWN AEROBIC AND ANAEROBIC 5 CC EACH  Final   Culture   Final    NO GROWTH < 24 HOURS Performed at [facility]    Report Status PENDING  Incomplete  Urine culture     Status: None   Collection Time: 11/19/14  4:40 PM  Result Value Ref Range Status   Specimen Description URINE, CLEAN CATCH  Final   Special  Requests NONE  Final   Culture   Final    4,000 COLONIES/mL INSIGNIFICANT GROWTH Performed at [facility]    Report Status 11/20/2014 FINAL  Final  MRSA PCR Screening     Status: None   Collection Time: 11/19/14 10:03 PM  Result Value Ref Range Status   MRSA by PCR NEGATIVE NEGATIVE Final  C difficile quick scan w PCR reflex     Status: None  Collection Time: 11/20/14 11:43 AM  Result Value Ref Range Status   C Diff antigen NEGATIVE NEGATIVE Final   C Diff toxin NEGATIVE NEGATIVE Final   C Diff interpretation Negative for toxigenic C. difficile  Final     Scheduled Meds: . antiseptic oral rinse  7 mL Mouth Rinse BID  . atorvastatin  10 mg Oral Daily  . dextromethorphan-guaiFENesin  1 tablet Oral BID  . diphenhydrAMINE  25 mg Oral QHS  . gabapentin  400 mg Oral TID  . lactose free nutrition  237 mL Oral BID BM  . levofloxacin (LEVAQUIN) IV  750 mg Intravenous Q48H  . metoprolol tartrate  12.5 mg Oral BID  . sodium chloride  10-40 mL Intracatheter Q12H   Continuous Infusions:

## 2014-11-25 NOTE — Progress Notes (Signed)
I was told to go to room 1504 to help patient go to the bathroom ,since i don't know how she's ambulate I grabbed the walker for her safety there was one of the family at the bedside she said No she's not going to used that and i explained to her what was the purpose of it and she yells at me and called me abnormal and the patient yelling too get out of my room.

## 2014-11-26 NOTE — Progress Notes (Addendum)
Patient ID: Allison Burgess, female   DOB: 14-Jun-1940, 74 y.o.   MRN: [id number] [facility] PROGRESS NOTE  Zenovia Justman UPJ:[id number] DOB: August 07, 1940 DOA: 11/19/2014 PCP: patient follows with oncology in [facility] Dr. Baird Cancer [phone])  Brief narrative:    74 y.o. female with past medical history of AML, on chemotherapy, hypertension, dyslipidemia who presented to ED with nausea, vomiting, diarrhea, left flank pain and confusion started 1-2 days prior to this admission. Patient also had fevers at home, T max 101 F. of note, patient has recently finished a course of Flagyl for C. difficile colitis. Last chemotherapy was about one week prior to this admission followed with Neulasta support.   On admission, patient briefly required pressor support with Levophed. CT renal stone protocol on the admission showed no acute abnormalities in the abdomen or pelvis but it was noted she has a non-obstructing bilateral renal calculi. Also seen were multiple uterine fibroids. CT abdomen pelvis showed slight increasing splenomegaly. She also has stable mild aortic dilatation at the aortic hiatus. Chest x-ray showed mild patchy density at the left base that could represent possible left base pneumonia. Sepsis criteria met on the admission for which reason patient placed on broad-spectrum abx, Vanco and Zosyn.  10/11 - transfused 2 U PRBC (irradiated blood) 10/12 - surgery consulted with no current recommendations for surgical intervention  10/13 - transferred to telemetry floor   Anticipated discharge: 10/18  Assessment/Plan:    Principal problem: Severe sepsis secondary to lobar pneumonia, unspecified organism / leukocytosis / Nausea, vomiting, diarrhea  - Sepsis criteria met on the admission. Suspected source of infection - pneumonia. Blood cultures so far show no growth. C. difficile is negative. Urine culture showed no growth. - Initially on vancomycin and Zosyn. Narrowed down to Levaquin 10/13 but  repeat CXR with persistent pneumonia now bilaterally. Stooped Levaquin 10/15 and started azithromycin and rocephin 10/15. - Of note, patient has received Lasix 40 mg IV 11/21/2014 in between the transfusion in addition to one dose of Lasix 11/22/2014.   Active problems: Left flank pain / splenomegaly / ascites  - CT renal stone protocol showed non-obstructing bilateral renal calculi. No evidence of pyelonephritis. Small amount of fluid near liver and spleen with splenomegaly and splenic infarcts.  - Per surgery, splenectomy not indicated.  Acute encephalopathy - Excellent mental status   Acute renal failure - Secondary to sepsis  - Resolved with sepsis  Hypokalemia - Secondary to Lasix. - Repleted - Check BMP tomorrow   History of AML - On chemotherapy, last chemotherapy infusion about one week prior to this admission with Neulasta support. - Patient follows with oncology in [facility], Dr. Baird Cancer. He was informed of patient's admission.   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 - Status post 2 units of PRBC transfusion on 11/21/2014. - Hemoglobin stable at 8.3  Thrombocytopenia - Secondary to malignancy and sequela of chemotherapy. 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  Family Communication:  plan of care discussed with the patient and her husband and son  at the bedside, discussed extensively with patient's sone this am Disposition Plan: Home likely by 10/18  IV access:  Peripheral  IV PAC  Procedures and diagnostic studies:    Dg Chest Port 1 View 2014-12-25  Persistent left pleural effusion with adjacent airspace disease, suspicious for infection. Cardiomegaly with increased pulmonary interstitial prominence suspicious for pulmonary venous congestion. Developing right base Airspace disease, likely atelectasis. Electronically Signed   By: Abigail Miyamoto M.D.   On: Dec 25, 2014 10:31   Dg Chest Port 1 View 11/22/2014  Increasing left basilar infiltrate. Electronically Signed   By: Inez Catalina M.D.   On: 11/22/2014 12:26   Ct Abdomen Pelvis W Contrast 11/20/2014   Slight increasing splenomegaly. Peripheral areas of low density noted which could reflect areas of infarct.  New small bilateral pleural effusions and bibasilar atelectasis, left greater than right.  Small amount of ascites around the liver, spleen and in the cul-de-sac.  Fibroid uterus.  Mild aortic dilatation at the aortic hiatus, 3.7 cm, stable.   Electronically Signed   By: Rolm Baptise M.D.   On: 11/20/2014 15:43   Dg Chest Port 1 View 11/19/2014 Rotated towards the left. Mild patchy density at the left base that could represent atelectasis or mild left base pneumonia.   Electronically Signed   By: Nelson Chimes M.D.   On: 11/19/2014 15:43   Ct Renal Stone Study 11/19/2014  1. Small amount of ascites in the upper abdomen. 2. No acute abnormalities otherwise involving the abdomen or pelvis. 3. 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 Bilobed infrarenal abdominal aortic aneurysm with maximum diameter 3.0 cm. 4. Nonobstructing bilateral renal calculi including a large (1.5 cm) calculus in the lower pole of the right kidney. 5. Approximate 8 mm calculus in the dependent portion of the distended urinary bladder. 6. Splenomegaly without evidence of focal splenic parenchymal abnormality. 7. Multiple uterine fibroids. What I believe is a degenerated, liquified approximate 3 cm fibroid arises from the left  posterior lower uterine segment.   Electronically Signed   By: Evangeline Dakin M.D.   On: 11/19/2014 17:25   Medical Consultants:  Surgery, Dr. Armandina Gemma  Oncology, Dr. Baird Cancer, phone call only to inform him of patient's admission.  Other Consultants:  Physical therapy Nutrition  IAnti-Infectives:   Zosyn and vancomycin 11/20/2014 --> 10/13 Levaquin 10/13 --> 2022/12/25 Azithromycin and rocephin 12/25/22 -->   Nora Rooke, MD  [facility] Pager [phone]  Time spent in minutes: 25 minutes  If 7PM-7AM, please contact night-coverage [email or web address] Password [email or web address] 11/26/2014, 11:09 AM   LOS: 7 days    HPI/Subjective: No acute overnight events. Patient reports pain better today.   Objective: Filed Vitals:   December 25, 2014 0552 December 25, 2014 1500 25-Dec-2014 2120 11/26/14 0534  BP: 115/51 129/57 131/58 136/56  Pulse: 74 87 74 68  Temp: 98.2 F (36.8 C) 99.6 F (37.6 C) 98.7 F (37.1 C) 98.2 F (36.8 C)  TempSrc: Oral Oral Oral Oral  Resp: 18 18 18 18   Height:      Weight:      SpO2: 99% 100% 99% 96%    Intake/Output Summary (Last 24 hours) at 11/26/14 1109 Last data filed at 2014-12-25 1809  Gross per 24 hour  Intake    240 ml  Output      0 ml  Net    240 ml    Exam:   General:  Pt is alert, no distress  Cardiovascular: Rate controlled, (+) S1, S2  Respiratory: bilateral air entry, no rhonchi   Abdomen: (+) BS, no tenderness on right, some tenderness on left but improving   Extremities: No leg swelling, palpable pulses   Neuro: Non focal   Data Reviewed: Basic Metabolic Panel:  Recent Labs Lab 11/20/14 0225 11/21/14 0343 11/22/14 0655 11/23/14 1248 11/24/14 0725  NA 140 135 136 134* 136  K 3.6 4.1 3.2* 3.4* 3.6  CL 111 109 103 97* 99*  CO2 22 22 26  32 32  GLUCOSE 103* 98 87 114* 95  BUN 9 8 8 8 7   CREATININE 0.80 0.79 0.90 0.72 0.77  CALCIUM 7.3* 7.6* 7.6* 7.7* 7.9*   Liver Function Tests:  Recent Labs Lab 11/19/14 1425 11/20/14 0225  11/21/14 0343  AST 29 24 22   ALT 19 18 15   ALKPHOS 49 47 47  BILITOT 0.8 0.7 0.9  PROT 4.9* 4.9* 4.7*  ALBUMIN 2.8* 2.8* 2.5*   No results for input(s): LIPASE, AMYLASE in the last 168 hours. No results for input(s): AMMONIA in the last 168 hours. CBC:  Recent Labs Lab 11/19/14 1425  11/20/14 0225 11/21/14 0343 11/22/14 0655 11/23/14 1248 11/24/14 0725 11/25/14 0800  WBC 40.7*  --  33.4* 23.2* 9.9 7.6 7.2 8.1  NEUTROABS 33.8*  --  28.7*  --   --   --   --   --   HGB 7.0*  < > 7.4* 6.8* 9.7* 8.9* 8.7* 8.3*  HCT 22.2*  < > 24.4* 21.8* 29.6* 27.9* 26.5* 26.5*  MCV 93.7  --  95.3 94.4 87.8 88.3 88.6 88.9  PLT 61*  --  48* 46* 32* 38* 33* 35*  < > = values in this interval not displayed. Cardiac Enzymes: No results for input(s): CKTOTAL, CKMB, CKMBINDEX, TROPONINI in the last 168 hours. BNP: Invalid input(s): POCBNP CBG: No results for input(s): GLUCAP in the last 168 hours.  Recent Results (from the past 240 hour(s))  Blood Culture (routine x 2)     Status: None (Preliminary result)   Collection Time: 11/19/14  2:37 PM  Result Value Ref Range Status   Specimen Description BLOOD BLOOD RIGHT FOREARM  Final   Special Requests BOTTLES DRAWN AEROBIC AND ANAEROBIC 5 CC EACH  Final   Culture   Final    NO GROWTH < 24 HOURS Performed at [facility]    Report Status PENDING  Incomplete  Blood Culture (routine x 2)     Status: None (Preliminary result)   Collection Time: 11/19/14  2:48 PM  Result Value Ref Range Status   Specimen Description BLOOD RIGHT HAND  Final   Special Requests BOTTLES DRAWN AEROBIC AND ANAEROBIC 5 CC EACH  Final   Culture   Final    NO GROWTH < 24 HOURS Performed at [facility]    Report Status PENDING  Incomplete  Urine culture     Status: None   Collection Time: 11/19/14  4:40 PM  Result Value Ref Range Status   Specimen Description URINE, CLEAN CATCH  Final   Special Requests NONE  Final   Culture   Final    4,000 COLONIES/mL  INSIGNIFICANT GROWTH Performed at [facility]    Report Status 11/20/2014 FINAL  Final  MRSA PCR Screening     Status: None   Collection Time: 11/19/14 10:03 PM  Result Value Ref Range Status   MRSA by PCR NEGATIVE NEGATIVE Final  C difficile quick scan w PCR reflex     Status: None  Collection Time: 11/20/14 11:43 AM  Result Value Ref Range Status   C Diff antigen NEGATIVE NEGATIVE Final   C Diff toxin NEGATIVE NEGATIVE Final   C Diff interpretation Negative for toxigenic C. difficile  Final     Scheduled Meds: . antiseptic oral rinse  7 mL Mouth Rinse BID  . atorvastatin  10 mg Oral Daily  . azithromycin  500 mg Intravenous Q24H  . cefTRIAXone (ROCEPHIN)  IV  1 g Intravenous Q24H  . dextromethorphan-guaiFENesin  1 tablet Oral BID  . diphenhydrAMINE  25 mg Oral QHS  . gabapentin  400 mg Oral TID  . lactose free nutrition  237 mL Oral BID BM  . metoprolol tartrate  12.5 mg Oral BID  . sodium chloride  10-40 mL Intracatheter Q12H   Continuous Infusions:

## 2014-11-27 ENCOUNTER — Inpatient Hospital Stay (HOSPITAL_COMMUNITY): Payer: Medicare Other

## 2014-11-27 LAB — CBC
HEMATOCRIT: 22.7 % — AB (ref 36.0–46.0)
HEMOGLOBIN: 7.3 g/dL — AB (ref 12.0–15.0)
MCH: 28.2 pg (ref 26.0–34.0)
MCHC: 32.2 g/dL (ref 30.0–36.0)
MCV: 87.6 fL (ref 78.0–100.0)
Platelets: 19 10*3/uL — CL (ref 150–400)
RBC: 2.59 MIL/uL — ABNORMAL LOW (ref 3.87–5.11)
RDW: 18.4 % — ABNORMAL HIGH (ref 11.5–15.5)
WBC: 5.4 10*3/uL (ref 4.0–10.5)

## 2014-11-27 LAB — PREPARE RBC (CROSSMATCH)

## 2014-11-27 MED ORDER — SODIUM CHLORIDE 0.9 % IV SOLN
Freq: Once | INTRAVENOUS | Status: AC
  Administered 2014-11-27: 11:00:00 via INTRAVENOUS

## 2014-11-27 MED ORDER — PHENOL 1.4 % MT LIQD
1.0000 | OROMUCOSAL | Status: DC | PRN
  Administered 2014-11-27: 1 via OROMUCOSAL
  Filled 2014-11-27: qty 177

## 2014-11-27 MED ORDER — DIPHENHYDRAMINE-ZINC ACETATE 2-0.1 % EX CREA
TOPICAL_CREAM | Freq: Two times a day (BID) | CUTANEOUS | Status: DC | PRN
  Administered 2014-11-27: 12:00:00 via TOPICAL
  Filled 2014-11-27: qty 28

## 2014-11-27 MED ORDER — BENZOCAINE 10 % MT GEL
Freq: Four times a day (QID) | OROMUCOSAL | Status: DC | PRN
  Administered 2014-11-27: 23:00:00 via OROMUCOSAL
  Filled 2014-11-27: qty 9.4

## 2014-11-27 NOTE — Progress Notes (Signed)
PT Cancellation Note  Patient Details Name: Allison Burgess MRN: [id number] DOB: September 24, 1940   Cancelled Treatment:    Reason Eval/Treat Not Completed: Patient at procedure or test/unavailable (RN starting 2nd bag of blood. Will follow. )   Philomena Doheny 11/27/2014, 2:08 PM [phone]

## 2014-11-27 NOTE — Progress Notes (Addendum)
CRITICAL VALUE ALERT  Critical value received:  Hgb 7.3  Date of notification:  11/27/14  Time of notification:  0830  Critical value read back: yes  Nurse who received alert:  Durwin Nora  MD notified (1st page):  Charlies Silvers  Time of first page:  0845  MD notified (2nd page):  Time of second page:  Responding MD:  Charlies Silvers  Time MD responded:  [phone]

## 2014-11-27 NOTE — Progress Notes (Signed)
Patient ID: Allison Burgess, female   DOB: 1940-12-09, 74 y.o.   MRN: [id number] [facility] PROGRESS NOTE  Annistyn Depass WUJ:[id number] DOB: Aug 01, 1940 DOA: 11/19/2014 PCP: patient follows with oncology in [facility] Dr. Baird Cancer [phone])  Brief narrative:    74 y.o. female with past medical history of AML, on chemotherapy, hypertension, dyslipidemia who presented to ED with nausea, vomiting, diarrhea, left flank pain and confusion started 1-2 days prior to this admission. Patient also had fevers at home, T max 101 F. of note, patient has recently finished a course of Flagyl for C. difficile colitis. Last chemotherapy was about one week prior to this admission followed with Neulasta support.   On admission, patient briefly required pressor support with Levophed. CT renal stone protocol on the admission showed no acute abnormalities in the abdomen or pelvis but it was noted she has a non-obstructing bilateral renal calculi. Also seen were multiple uterine fibroids. CT abdomen pelvis showed slight increasing splenomegaly. She also has stable mild aortic dilatation at the aortic hiatus. Chest x-ray showed mild patchy density at the left base that could represent possible left base pneumonia. Sepsis criteria met on the admission for which reason patient placed on broad-spectrum abx, Vanco and Zosyn. She was in SDU on admission. Transferred to telemetry floor 11/23/2014.  During this hospital stay she has received 2 units of PRBC transfusion (irradiated blood). We also consulted surgery because of left upper abdominal pain and finding of splenomegaly. Surgery did not recommend splenectomy. I also spoke with patient's oncology Dr. Willow Ora in [facility] who recommended supportive care with transfusions for low hemoglobin or platelets.He acknowledged that patient has had low neutrophils in past and it has been a challenge to get them up but on this admission her neutrophils are stable.  Family somewhat  frustrated with the patient;s care and there was an episode of patient yelling at the staff telling the nurse to get out of the room. I have updated the family on daily basis. I have repeated CXR (per family request) almost every other day to follow upon resolution of pneumonia. CXR from this am seems to be stable.   Anticipated discharge: if CBC stable tomorrow then she possibly can go home tomorrow.  Assessment/Plan:    Principal problem: Severe sepsis secondary to lobar pneumonia, unspecified organism / leukocytosis / Nausea, vomiting, diarrhea  - Sepsis criteria met on the admission. Suspected source of infections pneumonia. - She was on vanco and zosyn from the admission through 10/13 at which point we switched to [place]. Patient however still felt some shortness of breath so we repeated CXR 10/15 which then showed persistent pneumonia developing now bilaterally. Initially it was only on the left side. Antibiotics then changed to azithro and rocephin 10/15 and repeat CXR today shows stability in left basilar opacity and minimal right basialr atelectasis. - We will continue azithromycin and rocephin for now.  - Respiratory status is stable with no wheezing. - Of note, patient has received Lasix 40 mg IV 11/21/2014 in between the transfusion in addition to one dose of Lasix 11/22/2014.   Active problems: Left flank pain / splenomegaly / ascites  - CT renal stone protocol showed non-obstructing bilateral renal calculi. No evidence of pyelonephritis. Small amount of fluid near liver and spleen with splenomegaly and splenic infarcts.  - Per surgery, splenectomy not indicated.  Acute encephalopathy - Likely from sepsis but now excellent mental status   Acute renal failure - Secondary to sepsis  - Resolved  Hypokalemia - Secondary to Lasix. - Supplemented   History of AML - On chemotherapy, last chemotherapy infusion about one week prior to this admission with Neulasta support. -  Patient follows with oncology in [facility], Dr. Baird Cancer. He was informed of patient's admission.   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 - Status post 2 units of PRBC transfusion on 11/21/2014. - Hemoglobin down to 7.3 today so will give 1 unit of  PRBC today (irradiated blood). - Follow up CBC tomorrow am.  Thrombocytopenia - Secondary to malignancy and sequela of chemotherapy. - Platelets stable initially in 30's but today 19 so will give 1 unit platelets today.   Dyslipidemia - Continue Lipitor 10 mg daily  Essential hypertension - Continue metoprolol 12.5 mg PO BID  Chemotherapy induced neuropathy - Continue gabapentin   Moderate protein calorie malnutrition - In the context of chronic illness, malignancy  - Seen by nutritionist    DVT Prophylaxis  - SCD's bilaterally due to hrombocytopenia   Code Status: Full.  Family Communication:  plan of care discussed with the patient and her husband and son at the bedside daily, they have my contact information my cell number for any questions or concerns Disposition Plan: Home likely by 10/18 if CBC stable.   IV access:  Peripheral IV PAC  Procedures and diagnostic studies:    Dg Chest Port 1 View December 23, 2014  Left basilar opacity is again noted consistent with pneumonia or atelectasis with associated pleural effusion. Minimal right basilar subsegmental atelectasis is noted.   Dg Chest Port 1 View 11/25/2014  Persistent left pleural effusion with adjacent airspace disease, suspicious for infection. Cardiomegaly with increased pulmonary interstitial prominence suspicious for pulmonary venous congestion. Developing right base Airspace disease, likely atelectasis. Electronically Signed   By: Abigail Miyamoto M.D.   On: 11/25/2014 10:31   Dg Chest Port 1 View 11/22/2014  Increasing left basilar infiltrate. Electronically Signed   By: Inez Catalina M.D.   On:  11/22/2014 12:26   Ct Abdomen Pelvis W Contrast 11/20/2014   Slight increasing splenomegaly. Peripheral areas of low density noted which could reflect areas of infarct.  New small bilateral pleural effusions and bibasilar atelectasis, left greater than right.  Small amount of ascites around the liver, spleen and in the cul-de-sac.  Fibroid uterus.  Mild aortic dilatation at the aortic hiatus, 3.7 cm, stable.   Electronically Signed   By: Rolm Baptise M.D.   On: 11/20/2014 15:43   Dg Chest Port 1 View 11/19/2014 Rotated towards the left. Mild patchy density at the left base that could represent atelectasis or mild left base pneumonia.   Electronically Signed   By: Nelson Chimes M.D.   On: 11/19/2014 15:43   Ct Renal Stone Study 11/19/2014  1. Small amount of ascites in the upper abdomen. 2. No acute abnormalities otherwise involving the abdomen or pelvis. 3. 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 Bilobed infrarenal abdominal aortic aneurysm with maximum diameter 3.0 cm. 4. Nonobstructing bilateral renal calculi including a large (1.5 cm) calculus in the lower pole of the right kidney. 5. Approximate 8 mm calculus in the dependent portion of the distended urinary bladder. 6. Splenomegaly without evidence of focal splenic parenchymal abnormality. 7. Multiple uterine fibroids. What I believe is a degenerated, liquified approximate 3 cm fibroid arises from the left posterior lower uterine segment.   Electronically Signed   By: Sherran Needs.D.  On: 11/19/2014 17:25   Medical Consultants:  Surgery, Dr. Armandina Gemma  Oncology, Dr. Baird Cancer, phone call only to inform him of patient's admission.  Other Consultants:  Physical therapy Nutrition  IAnti-Infectives:   Zosyn and vancomycin 11/20/2014 --> 10/13 Levaquin 10/13 --> 10/15 Azithromycin and rocephin 10/15 -->   Mccall Lomax, MD  [facility] Pager [phone]  Time spent in minutes: 25 minutes  If 7PM-7AM,  please contact night-coverage [email or web address] Password [facility] 11/27/2014, 8:43 AM   LOS: 8 days    HPI/Subjective: No acute overnight events. Patient reports she feels better.   Objective: Filed Vitals:   11/26/14 0534 11/26/14 2051 11/26/14 2243 11/27/14 0503  BP: 136/56 123/40 119/42 115/47  Pulse: 68 81  76  Temp: 98.2 F (36.8 C) 98.2 F (36.8 C)  99.3 F (37.4 C)  TempSrc: Oral Oral  Oral  Resp: 18 18  18   Height:      Weight:      SpO2: 96% 94%  98%    Intake/Output Summary (Last 24 hours) at 11/27/14 0843 Last data filed at 11/26/14 0930  Gross per 24 hour  Intake    240 ml  Output      0 ml  Net    240 ml    Exam:   General:  Pt is alert, no distress  Cardiovascular: Rate controlled , S1, S2 (+)  Respiratory: bilateral air entry, no wheezing    Abdomen: non tender, (+) BS  Extremities: No edema, palpable pulses   Neuro: No focal deficits   Data Reviewed: Basic Metabolic Panel:  Recent Labs Lab 11/21/14 0343 11/22/14 0655 11/23/14 1248 11/24/14 0725  NA 135 136 134* 136  K 4.1 3.2* 3.4* 3.6  CL 109 103 97* 99*  CO2 22 26 32 32  GLUCOSE 98 87 114* 95  BUN 8 8 8 7   CREATININE 0.79 0.90 0.72 0.77  CALCIUM 7.6* 7.6* 7.7* 7.9*   Liver Function Tests:  Recent Labs Lab 11/21/14 0343  AST 22  ALT 15  ALKPHOS 47  BILITOT 0.9  PROT 4.7*  ALBUMIN 2.5*   No results for input(s): LIPASE, AMYLASE in the last 168 hours. No results for input(s): AMMONIA in the last 168 hours. CBC:  Recent Labs Lab 11/22/14 0655 11/23/14 1248 11/24/14 0725 11/25/14 0800 11/27/14 0750  WBC 9.9 7.6 7.2 8.1 5.4  HGB 9.7* 8.9* 8.7* 8.3* 7.3*  HCT 29.6* 27.9* 26.5* 26.5* 22.7*  MCV 87.8 88.3 88.6 88.9 87.6  PLT 32* 38* 33* 35* 19*   Cardiac Enzymes: No results for input(s): CKTOTAL, CKMB, CKMBINDEX, TROPONINI in the last 168 hours. BNP: Invalid input(s): POCBNP CBG: No results for input(s): GLUCAP in the last 168 hours.  Recent Results (from the  past 240 hour(s))  Blood Culture (routine x 2)     Status: None (Preliminary result)   Collection Time: 11/19/14  2:37 PM  Result Value Ref Range Status   Specimen Description BLOOD BLOOD RIGHT FOREARM  Final   Special Requests BOTTLES DRAWN AEROBIC AND ANAEROBIC 5 CC EACH  Final   Culture   Final    NO GROWTH < 24 HOURS Performed at [facility]    Report Status PENDING  Incomplete  Blood Culture (routine x 2)     Status: None (Preliminary result)   Collection Time: 11/19/14  2:48 PM  Result Value Ref Range Status   Specimen Description BLOOD RIGHT HAND  Final   Special Requests BOTTLES DRAWN AEROBIC AND ANAEROBIC 5  CC EACH  Final   Culture   Final    NO GROWTH < 24 HOURS Performed at [facility]    Report Status PENDING  Incomplete  Urine culture     Status: None   Collection Time: 11/19/14  4:40 PM  Result Value Ref Range Status   Specimen Description URINE, CLEAN CATCH  Final   Special Requests NONE  Final   Culture   Final    4,000 COLONIES/mL INSIGNIFICANT GROWTH Performed at [facility]    Report Status 11/20/2014 FINAL  Final  MRSA PCR Screening     Status: None   Collection Time: 11/19/14 10:03 PM  Result Value Ref Range Status   MRSA by PCR NEGATIVE NEGATIVE Final  C difficile quick scan w PCR reflex     Status: None   Collection Time: 11/20/14 11:43 AM  Result Value Ref Range Status   C Diff antigen NEGATIVE NEGATIVE Final   C Diff toxin NEGATIVE NEGATIVE Final   C Diff interpretation Negative for toxigenic C. difficile  Final     Scheduled Meds: . sodium chloride   Intravenous Once  . antiseptic oral rinse  7 mL Mouth Rinse BID  . atorvastatin  10 mg Oral Daily  . azithromycin  500 mg Intravenous Q24H  . cefTRIAXone (ROCEPHIN)  IV  1 g Intravenous Q24H  . dextromethorphan-guaiFENesin  1 tablet Oral BID  . diphenhydrAMINE  25 mg Oral QHS  . gabapentin  400 mg Oral TID  . lactose free nutrition  237 mL Oral BID BM  . metoprolol  tartrate  12.5 mg Oral BID  . sodium chloride  10-40 mL Intracatheter Q12H

## 2014-11-27 NOTE — Progress Notes (Signed)
Date: November 27, 2014 Chart reviewed for concurrent status and case management needs. hgb 7.3 receiving 1 unit of bld today. Will continue to follow patient for changes and needs: Velva Harman, RN, BSN, [place]   [phone]

## 2014-11-27 NOTE — Progress Notes (Signed)
Nutrition Follow-up  DOCUMENTATION CODES:   Non-severe (moderate) malnutrition in context of acute illness/injury  INTERVENTION:  - Continue Boost Plus BID - RD will continue to monitor for needs  NUTRITION DIAGNOSIS:   Increased nutrient needs related to catabolic illness, cancer and cancer related treatments as evidenced by estimated needs. -ongoing  GOAL:   Patient will meet greater than or equal to 90% of their needs -likely unmet on average  MONITOR:   PO intake, Supplement acceptance, Weight trends, Labs, I & O's  ASSESSMENT:   74 y.o. 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 As per the patient she had multiple episodes of nausea and vomiting and diarrhea last night. Patient was found to be confused today and was brought to the ER. Patient's family also stated patient was febrile in her house with temperatures running around 101F. Patient also has been having left flank pain and on exam patient had mild tenderness of the flank. Patient was recently admitted and discharged at [facility] 2 weeks ago for C. difficile colitis. Patient had just finished a course of Flagyl week ago. Patient had chemotherapy last week followed by Neulasta injection. In the ER patient was found to be initially hypotensive and was given fluid boluses and briefly was placed on Levophed.  10/17 Per MD notes, pt continues with poor appetite. Per chart review, pt ate 25% lunch 10/14 and 20% breakfast, 50% lunch and dinner 10/15, and 100% breakfast yesterday (10/16). Pt reports that her appetite has been fair to good but that she does not like the food in the hospital. Family member, who is at bedside states that she has been bringing food in for pt and pt has been eating well with these foods. Asked pt about Boost Plus but she states she does not recall if she has been receiving  them.  Pt likely variably meeting needs, but not consistently. MD note states possible d/c 10/18. Medications reviewed. Labs reviewed; Cl: 99 mmol/L, Ca: 7.9 mg/dL.   10/12 - Family reports stable UBW of 106 lbs which was used for estimation of nutrition needs given fluid overload at this time.  - Family reports that pt has only been eating a few bites the past 3-4 days; examples given of jello and half of a peanut butter sandwich.  - Pt states she is not feeling hungry this AM and did not eat breakfast.  - She states PTA she had a good appetite until she began to feel sick and have diarrhea (she states this was on 11/17/14).  - Pt indicates taste alteration which she experiences as bland taste since initiation of chemotherapy.  - At home she was drinking Boost and likes this supplement. She is interested in receiving it during hospitalization; will order BID and increase to TID if pt is drinking it more frequently. - Mild muscle and fat wasting noted. No previous weight hx in the chart but family reports pt has been stable at 106 lbs for several months prior to this admission. They state she gained fluid weight up to 122 lbs since admission and is now losing weight with diuresis.    Diet Order:  Diet Heart Room service appropriate?: Yes; Fluid consistency:: Thin  Skin:  Reviewed, no issues  Last BM:  10/15  Height:   Ht Readings from Last 1 Encounters:  11/19/14 5' (1.524 m)  Weight:   Wt Readings from Last 1 Encounters:  11/22/14 117 lb 15.1 oz (53.5 kg)    Ideal Body Weight:  45.45 kg (kg)  BMI:  Body mass index is 23.03 kg/(m^2).  Estimated Nutritional Needs:   Kcal:  1450-1650  Protein:  55-65 grams  Fluid:  1.7 L/day  EDUCATION NEEDS:   No education needs identified at this time     Jarome Matin, RD, LDN Inpatient Clinical Dietitian Pager # [phone] After hours/weekend pager # [phone]

## 2014-11-27 NOTE — Care Management Important Message (Signed)
Important Message  Patient Details  Name: Allison Burgess MRN: [id number] Date of Birth: [birth year]   Medicare Important Message Given:  Yes-third notification given    Camillo Flaming 11/27/2014, 12:39 [place] Message  Patient Details  Name: Allison Burgess MRN: [id number] Date of Birth: 1940-03-28   Medicare Important Message Given:  Yes-third notification given    Camillo Flaming 11/27/2014, 12:38 PM

## 2014-11-28 ENCOUNTER — Encounter (HOSPITAL_COMMUNITY): Payer: Self-pay | Admitting: Internal Medicine

## 2014-11-28 DIAGNOSIS — I714 Abdominal aortic aneurysm, without rupture, unspecified: Secondary | ICD-10-CM | POA: Diagnosis present

## 2014-11-28 DIAGNOSIS — G620 Drug-induced polyneuropathy: Secondary | ICD-10-CM

## 2014-11-28 DIAGNOSIS — E440 Moderate protein-calorie malnutrition: Secondary | ICD-10-CM

## 2014-11-28 DIAGNOSIS — A419 Sepsis, unspecified organism: Principal | ICD-10-CM

## 2014-11-28 DIAGNOSIS — D696 Thrombocytopenia, unspecified: Secondary | ICD-10-CM

## 2014-11-28 DIAGNOSIS — D6481 Anemia due to antineoplastic chemotherapy: Secondary | ICD-10-CM

## 2014-11-28 DIAGNOSIS — G9340 Encephalopathy, unspecified: Secondary | ICD-10-CM

## 2014-11-28 DIAGNOSIS — J181 Lobar pneumonia, unspecified organism: Secondary | ICD-10-CM

## 2014-11-28 DIAGNOSIS — J189 Pneumonia, unspecified organism: Secondary | ICD-10-CM

## 2014-11-28 DIAGNOSIS — C9200 Acute myeloblastic leukemia, not having achieved remission: Secondary | ICD-10-CM

## 2014-11-28 DIAGNOSIS — T451X5A Adverse effect of antineoplastic and immunosuppressive drugs, initial encounter: Secondary | ICD-10-CM

## 2014-11-28 DIAGNOSIS — Z8719 Personal history of other diseases of the digestive system: Secondary | ICD-10-CM

## 2014-11-28 DIAGNOSIS — N179 Acute kidney failure, unspecified: Secondary | ICD-10-CM

## 2014-11-28 HISTORY — DX: Abdominal aortic aneurysm, without rupture, unspecified: I71.40

## 2014-11-28 HISTORY — DX: Abdominal aortic aneurysm, without rupture: I71.4

## 2014-11-28 LAB — CBC
HCT: 26.6 % — ABNORMAL LOW (ref 36.0–46.0)
Hemoglobin: 8.6 g/dL — ABNORMAL LOW (ref 12.0–15.0)
MCH: 28.6 pg (ref 26.0–34.0)
MCHC: 32.3 g/dL (ref 30.0–36.0)
MCV: 88.4 fL (ref 78.0–100.0)
PLATELETS: 38 10*3/uL — AB (ref 150–400)
RBC: 3.01 MIL/uL — AB (ref 3.87–5.11)
RDW: 17.9 % — AB (ref 11.5–15.5)
WBC: 3.8 10*3/uL — AB (ref 4.0–10.5)

## 2014-11-28 LAB — PREPARE PLATELET PHERESIS: UNIT DIVISION: 0

## 2014-11-28 LAB — BASIC METABOLIC PANEL
Anion gap: 7 (ref 5–15)
BUN: 7 mg/dL (ref 6–20)
CALCIUM: 7.9 mg/dL — AB (ref 8.9–10.3)
CO2: 34 mmol/L — ABNORMAL HIGH (ref 22–32)
Chloride: 97 mmol/L — ABNORMAL LOW (ref 101–111)
Creatinine, Ser: 0.74 mg/dL (ref 0.44–1.00)
GFR calc Af Amer: >60 mL/min (ref >60)
GLUCOSE: 112 mg/dL — AB (ref 65–99)
POTASSIUM: 3.7 mmol/L (ref 3.5–5.1)
SODIUM: 138 mmol/L (ref 135–145)

## 2014-11-28 LAB — TYPE AND SCREEN
ABO/RH(D): O POS
ANTIBODY SCREEN: NEGATIVE
UNIT DIVISION: 0

## 2014-11-28 MED ORDER — DM-GUAIFENESIN ER 30-600 MG PO TB12: 1.0000 | ORAL_TABLET | Freq: Two times a day (BID) | ORAL | Status: AC

## 2014-11-28 MED ORDER — BENZOCAINE 10 % MT GEL: Freq: Four times a day (QID) | OROMUCOSAL | Status: AC | PRN

## 2014-11-28 MED ORDER — HEPARIN SOD (PORK) LOCK FLUSH 100 UNIT/ML IV SOLN
500.0000 [IU] | INTRAVENOUS | Status: AC | PRN
  Administered 2014-11-28: 500 [IU]

## 2014-11-28 MED ORDER — BOOST PLUS PO LIQD: 237.0000 mL | Freq: Two times a day (BID) | ORAL | Status: AC

## 2014-11-28 NOTE — Discharge Summary (Signed)
Physician Discharge Summary  Allison Burgess RJJ:[id number] DOB: 1940/10/02 DOA: 11/19/2014  PCP: PCP is in [place]. Dr. Flavia Shipper  Admit date: 11/19/2014 Discharge date: 11/28/2014   Recommendations for Outpatient Follow-Up:   1. PCP is out of town, so recommend follow-up with oncologist locally. 2. We'll need repeat abdominal ultrasound in one year to assess stability of incidentally discovered thoracic aortic aneurysm.   Discharge Diagnosis:   Principal Problem:    Sepsis due to pneumonia [facility]) Active Problems:    Nausea vomiting and diarrhea    Acute encephalopathy    Essential hypertension    AML (acute myelogenous leukemia) (HCC)    Left flank pain    Malnutrition of moderate degree    Lobar pneumonia, unspecified organism (HCC)    Leukocytosis    Thrombocytopenia (HCC)    ARF (acute renal failure) (HCC)    Antineoplastic chemotherapy induced anemia    Anemia of chronic disease    Hypokalemia    Dyslipidemia    Chemotherapy-induced neuropathy (HCC)    History of Clostridium difficile colitis    Thoracic aortic aneurysm    Left nephrolithiasis   Discharge disposition:  Home.   Discharge Condition: Improved.  Diet recommendation: Regular.   History of Present Illness:   Allison Burgess is an 74 y.o. female with a PMH of AML, on chemotherapy, hypertension, dyslipidemia who was admitted 11/19/14 with nausea, vomiting, diarrhea, left flank pain and confusion started 1-2 days prior to this admission. Patient also had fevers at home, T max 101 F. of note, patient has recently finished a course of Flagyl for C. difficile colitis. Last chemotherapy was about one week prior to this admission followed with Neulasta support.   On admission, patient briefly required pressor support with Levophed. CT renal stone protocol on the admission showed no acute abnormalities in the abdomen or pelvis but it was noted she has a non-obstructing bilateral renal calculi. Also  seen were multiple uterine fibroids. CT abdomen pelvis showed slight increasing splenomegaly. She also has stable mild aortic dilatation at the aortic hiatus. Chest x-ray showed mild patchy density at the left base that could represent possible left base pneumonia. Sepsis criteria met on the admission for which reason patient placed on broad-spectrum abx, Vanco and Zosyn. She was in SDU on admission. Transferred to telemetry floor 11/23/2014.  During this hospital stay she has received 2 units of PRBC transfusion (irradiated blood). We also consulted surgery because of left upper abdominal pain and finding of splenomegaly. Surgery did not recommend splenectomy. Dr. Baird Cancer in [facility]  recommended supportive care with transfusions for low hemoglobin or platelets. He acknowledged that patient has had low neutrophils in past and it has been a challenge to get them up but on this admission her neutrophils are stable.   Hospital Course by Problem:   Principal problem: Severe sepsis secondary to lobar pneumonia, unspecified organism / leukocytosis / Nausea, vomiting, diarrhea  - Sepsis criteria met on the admission. Suspected source of infection is pneumonia. - Completed a course of multiple different antibiotics throughout her hospital stay. Would not continue antibiotics at discharge. - Respiratory status is stable with no wheezing. Also received Lasix which improved respiratory status. - Blood cultures negative.  Active problems: Left flank pain / splenomegaly / ascites  - CT renal stone protocol showed non-obstructing bilateral renal calculi. No evidence of pyelonephritis.  - Noted to have a small amount of fluid near liver and spleen with splenomegaly and splenic infarcts.  - Per surgery,  splenectomy not indicated.  Acute encephalopathy - Resolved with treatment of underlying sepsis.   Acute renal failure/left nephrolithiasis - Secondary to sepsis, resolved.  - Left nephrolithiasis  found incidentally, no evidence of obstructive uropathy.  Hypokalemia - Secondary to Lasix. - Supplemented.  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 - Patient follows with oncology in [facility], Dr. Baird Cancer. He was informed of patient's admission.   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 - Status post 2 units of PRBC transfusion on 11/21/2014 and 1 additional unit 11/27/14.  Thrombocytopenia - Secondary to malignancy and sequela of chemotherapy. - Status post 1 unit of platelets 11/27/14.   Dyslipidemia - Continue Lipitor 10 mg daily.  Essential hypertension - Continue metoprolol 12.5 mg PO BID.  Chemotherapy induced neuropathy - Continue gabapentin.   Moderate protein calorie malnutrition - In the context of chronic illness, malignancy.  - Seen by nutritionist.   Thoracic aortic aneurysm - Noted incidentally on CT scan. 3.0 cm. Recommend follow-up ultrasound in one year's time.  Medical Consultants:    Dr. Armandina Gemma, Surgery.   Discharge Exam:   Filed Vitals:   11/28/14 1521  BP: 114/50  Pulse: 56  Temp: 97.7 F (36.5 C)  Resp: 20   Filed Vitals:   11/28/14 0454 11/28/14 1041 11/28/14 1300 11/28/14 1521  BP: 129/51   114/50  Pulse: 68   56  Temp: 98.6 F (37 C)   97.7 F (36.5 C)  TempSrc: Oral   Oral  Resp: 18   20  Height:      Weight:  53.706 kg (118 lb 6.4 oz) 49.896 kg (110 lb)   SpO2: 95%   100%    Gen:  NAD Cardiovascular:  RRR, No M/R/G Respiratory: Lungs CTAB Gastrointestinal: Abdomen soft, NT/ND with normal active bowel sounds. Extremities: No C/E/C   The results of significant diagnostics from this hospitalization (including imaging, microbiology, ancillary and laboratory) are listed below for reference.     Procedures and Diagnostic Studies:   US Abdomen  Complete  12-11-2014  CLINICAL DATA:  Ascites. EXAM: ULTRASOUND ABDOMEN COMPLETE COMPARISON:  CT 11/20/2014. FINDINGS: Gallbladder: Multiple tiny approximately 3 mm gallbladder polyps. Gallbladder wall thickness normal at 1.1 mm. Negative Murphy sign. Common bile duct: Diameter: 7.6 mm Liver: No focal hepatic abnormality.  Hepatic echotexture normal. IVC: No abnormality visualized. Pancreas: Visualized portion unremarkable. Spleen: Spleen is enlarged 15 cm with a volume of 857 cubic cm. Right Kidney: Length: 11.9 cm. Echogenicity within normal limits. No mass or hydronephrosis visualized. 12 mm nonobstructing lower pole calyceal stone. Left Kidney: Length: 10.9 cm. Echogenicity within normal limits. No mass or hydronephrosis visualized. Abdominal aorta: No aneurysm visualized. Other findings: Mild ascites. IMPRESSION: 1.  Splenomegaly. 2 mild ascites. 3.  Nonobstructing left nephrolithiasis. Electronically Signed   By: [place]   On: December 11, 2014 09:29   Ct Abdomen Pelvis W Contrast  11/20/2014  CLINICAL DATA:  Leukemia. Ongoing chemotherapy. Abdominal pain and distention. Vomiting, diarrhea. EXAM: CT ABDOMEN AND PELVIS WITH CONTRAST TECHNIQUE: Multidetector CT imaging of the abdomen and pelvis was performed using the standard protocol following bolus administration of intravenous contrast. CONTRAST:  167m OMNIPAQUE IOHEXOL 300 MG/ML  SOLN COMPARISON:  11/19/2014 FINDINGS: Small bilateral pleural effusions, new since prior study, left slightly larger than right. Dependent bibasilar atelectasis. Heart is borderline in size. Mild dilatation of the aorta at the hiatus,  measuring 3.7 cm, stable. Enlarged spleen with a craniocaudal length of 13.7 cm compared to 13.4 cm previously. There is a heterogeneous enhancement pattern peripherally within the spleen. This may simply be related to bolus timing and the enlarged spleen, but this persists on delayed kidney images. This may reflect areas of splenic  infarcts. Liver, gallbladder, pancreas, adrenals are unremarkable. 10 mm nonobstructing lower pole right renal stone. Punctate nonobstructing stones in the lower pole of the left kidney. These are stable. No hydronephrosis. Small amount of free fluid around the liver and spleen as well as in the cul-de-sac. Fibroid uterus noted, some which are calcified. No adnexal masses. Urinary bladder is unremarkable. Stomach, large and small bowel are unremarkable. Aorta is heavily calcified with maximum diameter 2.5 cm distally. Right common iliac stent noted. No acute bony abnormality or focal bone lesion. IMPRESSION: Slight increasing splenomegaly. Peripheral areas of low density noted which could reflect areas of infarct. New small bilateral pleural effusions and bibasilar atelectasis, left greater than right. Small amount of ascites around the liver, spleen and in the cul-de-sac. Fibroid uterus. Mild aortic dilatation at the aortic hiatus, 3.7 cm, stable. Electronically Signed   By: Rolm Baptise M.D.   On: 11/20/2014 15:43   Dg Chest Port 1 View  11/27/2014  CLINICAL DATA:  Pneumonia. EXAM: PORTABLE CHEST 1 VIEW COMPARISON:  November 25, 2014. FINDINGS: Stable cardiomegaly. No pneumothorax is noted. Right internal jugular Port-A-Cath is again noted with distal tip in expected position of right atrium. Minimal right basilar subsegmental atelectasis is noted. Left basilar opacity is again noted concerning for atelectasis or pneumonia with associated pleural effusion. Bony thorax is unremarkable. IMPRESSION: Left basilar opacity is again noted consistent with pneumonia or atelectasis with associated pleural effusion. Minimal right basilar subsegmental atelectasis is noted. Electronically Signed   By: Marijo Conception, M.D.   On: 11/27/2014 09:14   Dg Chest Port 1 View  11/25/2014  CLINICAL DATA:  Weakness and shortness of breath. EXAM: PORTABLE CHEST 1 VIEW COMPARISON:  11/22/2014 FINDINGS: Right-sided Port-A-Cath  terminates at the high right atrium. Patient rotated to the left. Mild cardiomegaly. Atherosclerosis in the transverse aorta. Small left pleural effusion. No pneumothorax. Hyperinflation. Pulmonary interstitial thickening is slightly increased. Left base airspace disease persists. Developing right base airspace disease. IMPRESSION: Persistent left pleural effusion with adjacent airspace disease, suspicious for infection. Cardiomegaly with increased pulmonary interstitial prominence suspicious for pulmonary venous congestion. Developing right base Airspace disease, likely atelectasis. Electronically Signed   By: Abigail Miyamoto M.D.   On: 11/25/2014 10:31   Dg Chest Port 1 View  11/22/2014  CLINICAL DATA:  Shortness of Breath EXAM: PORTABLE CHEST - 1 VIEW COMPARISON:  11/19/2014 FINDINGS: Cardiac shadow is stable. Increasing left basilar infiltrate is noted when compare with the prior exam. Diffuse interstitial changes are again identified. No bony abnormality is seen. Right chest wall port is again seen and stable. IMPRESSION: Increasing left basilar infiltrate. Electronically Signed   By: Inez Catalina M.D.   On: 11/22/2014 12:26   Dg Chest Port 1 View  11/19/2014  CLINICAL DATA:  Cough and chest congestion over the last few days. EXAM: PORTABLE CHEST 1 VIEW COMPARISON:  None. FINDINGS: Power port inserted from a right internal jugular approach has its tip at the SVC RA junction. The patient is rotated towards the left. Heart size is normal. The aorta shows calcification. Right lung is clear. The left lung is clear except for mild atelectasis or infiltrate at the base. No  dense consolidation. No lobar collapse. No effusion. IMPRESSION: Rotated towards the left. Mild patchy density at the left base that could represent atelectasis or mild left base pneumonia. Electronically Signed   By: Nelson Chimes M.D.   On: 11/19/2014 15:43   Ct Renal Stone Study  11/19/2014  CLINICAL DATA:  74 year old who is currently  undergoing chemotherapy for leukemia, presenting to the emergency department with fever, generalized abdominal pain and hypotension, possible sepsis. EXAM: CT ABDOMEN AND PELVIS WITHOUT CONTRAST TECHNIQUE: Multidetector CT imaging of the abdomen and pelvis was performed following the standard protocol without IV contrast. COMPARISON:  None. FINDINGS: Respiratory motion blurred many of the images of the upper and mid abdomen. Hepatobiliary: Liver normal in size and appearance. No calcified gallstones. No gallbladder wall thickening or pericholecystic inflammation. No biliary ductal dilation. Spleen: Enlarged measuring approximately 12.0 x 7.3 x 13.4 cm yielding a volume of approximately 600 ml. No focal splenic parenchymal abnormality allowing for the unenhanced technique. Pancreas:  Normal unenhanced appearance. Adrenal glands:  Normal in appearance. Genitourinary: Approximate 1.5 cm nonobstructing calculus in a lower pole calix of the right kidney. Very small (2-3 mm) calculi in lower pole calices of the left kidney. No evidence of hydronephrosis involving either kidney. Within the limits of the unenhanced technique, no focal parenchymal abnormality involving either kidney other than an exophytic simple cyst arising from the lower pole of the left kidney. Approximate 8 mm calculus in the dependent portion of the urinary bladder. Distended urinary bladder. Multiple uterine fibroids, including a calcified, degenerated fibroid arising from the fundus approximate 3.1 x 3.3 cm cystic structure low in the left side of the pelvis which appears to arise from the lower uterine segment. Gastrointestinal: Stomach normal in appearance for degree of distention. Normal-appearing small bowel. Moderate colonic stool burden. No evidence of diverticulosis. Normal appendix in the right upper pelvis. Ascites: Small amount of ascites in the perihepatic and perisplenic regions. No significant ascites elsewhere. Vascular: Severe  aortoiliofemoral atherosclerosis. 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 Bilobed infrarenal abdominal aortic aneurysm with the upper and lower portions each having a maximum diameter of 3.0 cm. Bilateral common iliac artery stents. Lymphatic:  No pathologic lymphadenopathy in the abdomen or pelvis. Other findings: None. Musculoskeletal: Thoracolumbar scoliosis convex left. Severe symmetric joint space narrowing involving both hips. Severe facet degenerative changes at L4-5 and L5-S1, left much greater than right. Osseous demineralization. Visualized lower thorax: Heart enlarged with left ventricular predominance. Apparent right coronary artery stenting. Scarring in the lingula and left lower lobe. IMPRESSION: 1. Small amount of ascites in the upper abdomen. 2. No acute abnormalities otherwise involving the abdomen or pelvis. 3. 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 Bilobed infrarenal abdominal aortic aneurysm with maximum diameter 3.0 cm. 4. Nonobstructing bilateral renal calculi including a large (1.5 cm) calculus in the lower pole of the right kidney. 5. Approximate 8 mm calculus in the dependent portion of the distended urinary bladder. 6. Splenomegaly without evidence of focal splenic parenchymal abnormality. 7. Multiple uterine fibroids. What I believe is a degenerated, liquified approximate 3 cm fibroid arises from the left posterior lower uterine segment. Electronically Signed   By: Evangeline Dakin M.D.   On: 11/19/2014 17:25     Labs:   Basic Metabolic Panel:  Recent Labs Lab 11/22/14 0655 11/23/14 1248 11/24/14 0725 11/28/14 0447  NA 136 134* 136 138  K 3.2* 3.4* 3.6 3.7  CL 103 97* 99* 97*  CO2 26  32 32 34*  GLUCOSE 87 114* 95 112*  BUN 8 8 7 7   CREATININE 0.90 0.72 0.77 0.74  CALCIUM 7.6* 7.7* 7.9* 7.9*   GFR Estimated Creatinine Clearance: 45 mL/min (by C-G formula based on Cr of 0.74).  CBC:  Recent  Labs Lab 11/23/14 1248 11/24/14 0725 11/25/14 0800 11/27/14 0750 11/28/14 0447  WBC 7.6 7.2 8.1 5.4 3.8*  HGB 8.9* 8.7* 8.3* 7.3* 8.6*  HCT 27.9* 26.5* 26.5* 22.7* 26.6*  MCV 88.3 88.6 88.9 87.6 88.4  PLT 38* 33* 35* 19* 38*   Microbiology Recent Results (from the past 240 hour(s))  Blood Culture (routine x 2)     Status: None   Collection Time: 11/19/14  2:37 PM  Result Value Ref Range Status   Specimen Description BLOOD BLOOD RIGHT FOREARM  Final   Special Requests BOTTLES DRAWN AEROBIC AND ANAEROBIC 5 CC   Final   Culture   Final    NO GROWTH 5 DAYS Performed at [facility]    Report Status 11/24/2014 FINAL  Final  Blood Culture (routine x 2)     Status: None   Collection Time: 11/19/14  2:48 PM  Result Value Ref Range Status   Specimen Description BLOOD RIGHT HAND  Final   Special Requests BOTTLES DRAWN AEROBIC AND ANAEROBIC 5 CC EACH  Final   Culture   Final    NO GROWTH 5 DAYS Performed at [facility]    Report Status 11/24/2014 FINAL  Final  Urine culture     Status: None   Collection Time: 11/19/14  4:40 PM  Result Value Ref Range Status   Specimen Description URINE, CLEAN CATCH  Final   Special Requests NONE  Final   Culture   Final    4,000 COLONIES/mL INSIGNIFICANT GROWTH Performed at [facility]    Report Status 11/20/2014 FINAL  Final  MRSA PCR Screening     Status: None   Collection Time: 11/19/14 10:03 PM  Result Value Ref Range Status   MRSA by PCR NEGATIVE NEGATIVE Final    Comment:        The GeneXpert MRSA Assay (FDA approved for NASAL specimens only), is one component of a comprehensive MRSA colonization surveillance program. It is not intended to diagnose MRSA infection nor to guide or monitor treatment for MRSA infections.   C difficile quick scan w PCR reflex     Status: None   Collection Time: 11/20/14 11:43 AM  Result Value Ref Range Status   C Diff antigen NEGATIVE NEGATIVE Final   C Diff toxin NEGATIVE  NEGATIVE Final   C Diff interpretation Negative for toxigenic C. difficile  Final     Discharge Instructions:   Discharge Instructions    Call MD for:  extreme fatigue    Complete by:  As directed      Call MD for:  persistant dizziness or light-headedness    Complete by:  As directed      Call MD for:  temperature >100.4    Complete by:  As directed      Diet general    Complete by:  As directed      Increase activity slowly    Complete by:  As directed             Medication List    STOP taking these medications        levofloxacin 500 MG tablet  Commonly known as:  LEVAQUIN  metroNIDAZOLE 500 MG tablet  Commonly known as:  FLAGYL      TAKE these medications        amLODipine 10 MG tablet  Commonly known as:  NORVASC  Take 10 mg by mouth daily.     atorvastatin 10 MG tablet  Commonly known as:  LIPITOR  Take 10 mg by mouth daily.     benzocaine 10 % mucosal gel  Commonly known as:  ORAJEL  Use as directed in the mouth or throat 4 (four) times daily as needed for mouth pain.     dexamethasone 4 MG tablet  Commonly known as:  DECADRON  Take 4 mg by mouth daily. For 1 week following chemo therapy     dextromethorphan-guaiFENesin 30-600 MG 12hr tablet  Commonly known as:  MUCINEX DM  Take 1 tablet by mouth 2 (two) times daily.     diphenhydrAMINE 25 mg capsule  Commonly known as:  BENADRYL  Take 50 mg by mouth at bedtime.     gabapentin 800 MG tablet  Commonly known as:  NEURONTIN  Take 800 mg by mouth 3 (three) times daily.     hydrocortisone cream 0.5 %  Apply 1 application topically 2 (two) times daily as needed (for bruising).     lactose free nutrition Liqd  Take 237 mLs by mouth 2 (two) times daily between meals.     metoprolol tartrate 25 MG tablet  Commonly known as:  LOPRESSOR  Take 12.5 mg by mouth 2 (two) times daily.     oxycodone 5 MG capsule  Commonly known as:  OXY-IR  Take 5 mg by mouth every 4 (four) hours as needed for  pain.     OXYGEN  Inhale 2 L into the lungs at bedtime.     PRESCRIPTION MEDICATION  Chemo - [facility] [phone] Dr. Baird Cancer           Follow-up Information    Follow up with Verner Chol, MD On 12/07/2014.   Specialty:  Hematology and Oncology   Why:  Please follow up with Dr. Baird Cancer on Thursday, October 27th at 1:30pm    Contact information:   [address] [place] [address] [phone]        Time coordinating discharge: 35 minutes.  Signed:  RAMA,CHRISTINA  Pager [phone] [facility] 11/28/2014, 4:19 PM

## 2014-11-28 NOTE — Progress Notes (Signed)
Diamond Nickel to be D/C'd Home per MD order.  Discussed prescriptions and follow up appointments with the patient. Prescriptions given to patient, medication list explained in detail. Pt verbalized understanding.    Medication List    STOP taking these medications        levofloxacin 500 MG tablet  Commonly known as:  LEVAQUIN     metroNIDAZOLE 500 MG tablet  Commonly known as:  FLAGYL      TAKE these medications        amLODipine 10 MG tablet  Commonly known as:  NORVASC  Take 10 mg by mouth daily.     atorvastatin 10 MG tablet  Commonly known as:  LIPITOR  Take 10 mg by mouth daily.     benzocaine 10 % mucosal gel  Commonly known as:  ORAJEL  Use as directed in the mouth or throat 4 (four) times daily as needed for mouth pain.     dexamethasone 4 MG tablet  Commonly known as:  DECADRON  Take 4 mg by mouth daily. For 1 week following chemo therapy     dextromethorphan-guaiFENesin 30-600 MG 12hr tablet  Commonly known as:  MUCINEX DM  Take 1 tablet by mouth 2 (two) times daily.     diphenhydrAMINE 25 mg capsule  Commonly known as:  BENADRYL  Take 50 mg by mouth at bedtime.     gabapentin 800 MG tablet  Commonly known as:  NEURONTIN  Take 800 mg by mouth 3 (three) times daily.     hydrocortisone cream 0.5 %  Apply 1 application topically 2 (two) times daily as needed (for bruising).     lactose free nutrition Liqd  Take 237 mLs by mouth 2 (two) times daily between meals.     metoprolol tartrate 25 MG tablet  Commonly known as:  LOPRESSOR  Take 12.5 mg by mouth 2 (two) times daily.     oxycodone 5 MG capsule  Commonly known as:  OXY-IR  Take 5 mg by mouth every 4 (four) hours as needed for pain.     OXYGEN  Inhale 2 L into the lungs at bedtime.     PRESCRIPTION MEDICATION  Chemo - [facility] healthcare [facility] [phone] Dr. Lowry Ram Vitals:   11/28/14 1521  BP: 114/50  Pulse: 56  Temp: 97.7 F (36.5 C)  Resp: 20     Skin clean, dry and intact without evidence of skin break down, no evidence of skin tears noted. IV catheter discontinued intact. Site without signs and symptoms of complications. Dressing and pressure applied. Pt denies pain at this time. No complaints noted.  An After Visit Summary was printed and given to the patient. Patient escorted via [place], and D/C home via private auto.  Nonie Hoyer S 11/28/2014 4:02 PM

## 2016-03-13 DEATH — deceased

## 2016-06-08 IMAGING — DX DG CHEST 1V PORT
1 series · 1 of 1 positions shown · non-contrast
Comparison: November 25, 2014.

CLINICAL DATA: Pneumonia.

EXAM:
PORTABLE CHEST 1 VIEW

[chest ap]
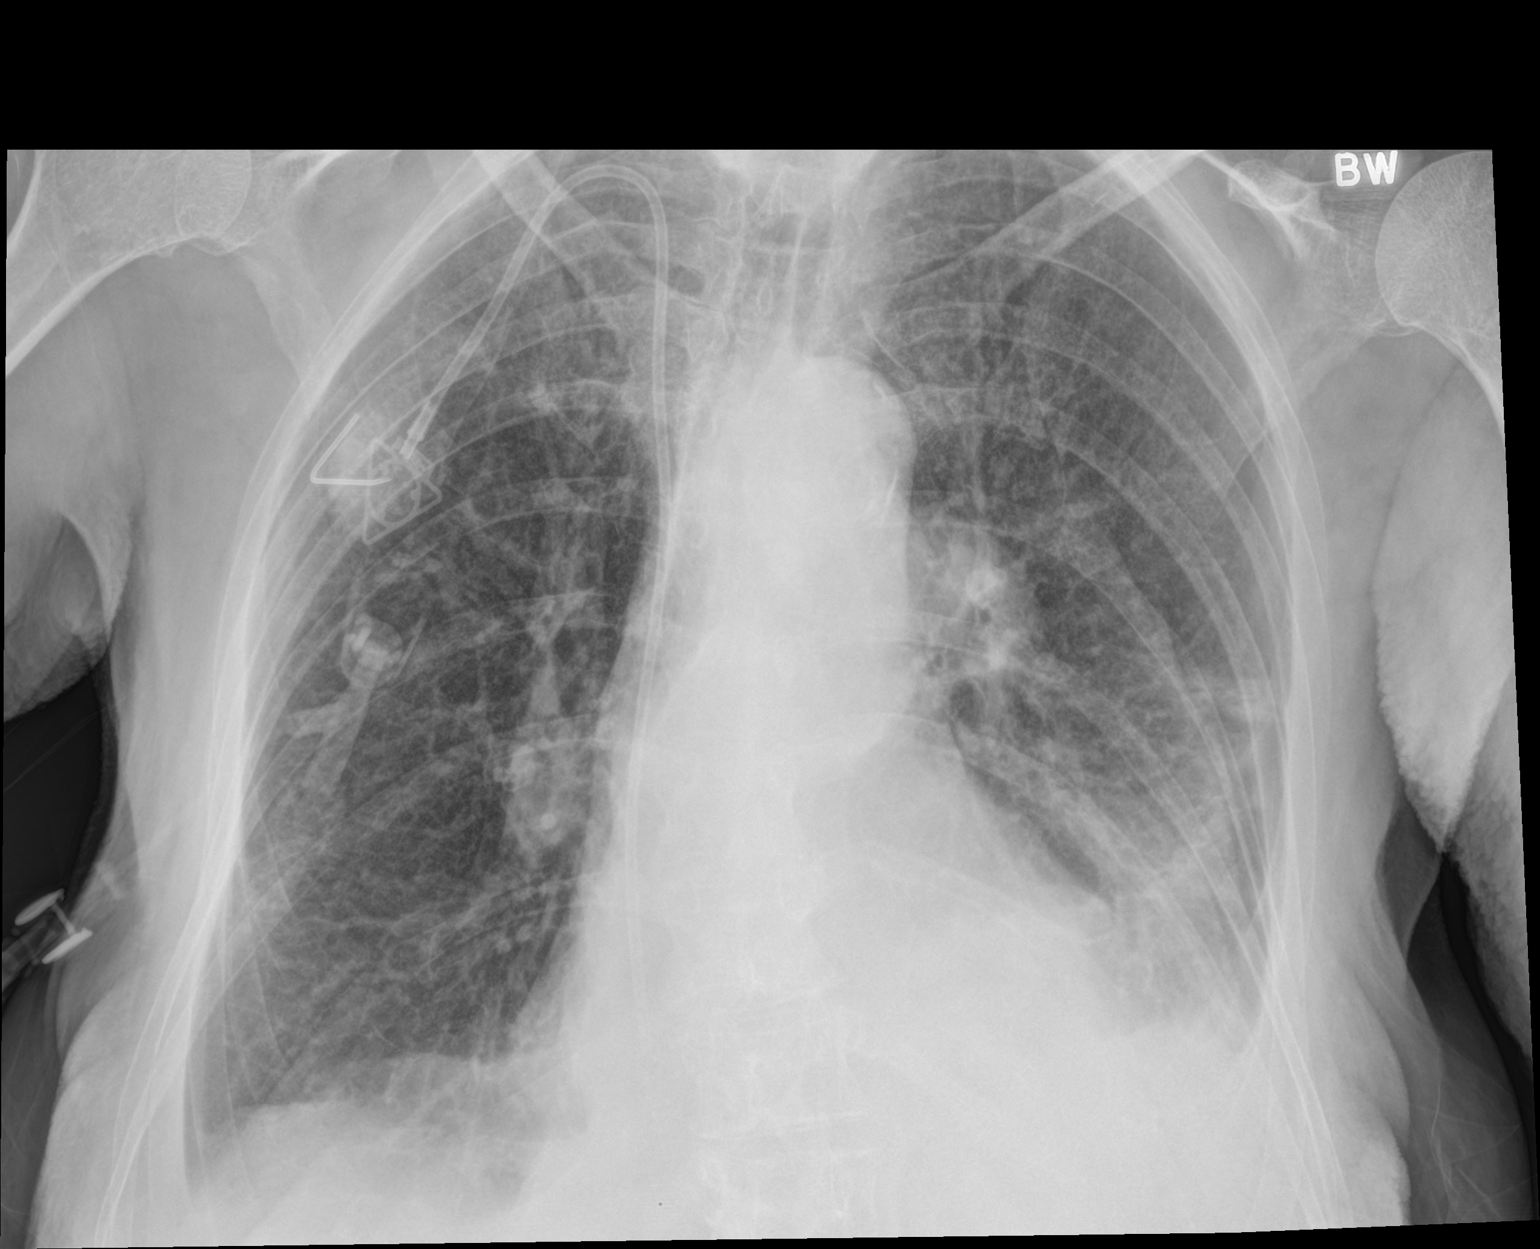

[1 of 1 positions shown; findings below may reference images not displayed]

FINDINGS: Stable cardiomegaly. No pneumothorax is noted. Right internal
jugular Port-A-Cath is again noted with distal tip in expected
position of right atrium. Minimal right basilar subsegmental
atelectasis is noted. Left basilar opacity is again noted concerning
for atelectasis or pneumonia with associated pleural effusion. Bony
thorax is unremarkable.
IMPRESSION: Left basilar opacity is again noted consistent with pneumonia or
atelectasis with associated pleural effusion. Minimal right basilar
subsegmental atelectasis is noted.

## 2017-03-01 IMAGING — US US ABDOMEN COMPLETE
1 series · 14 of 25 positions shown · non-contrast
Comparison: CT 11/20/2014.

CLINICAL DATA: Ascites.

EXAM:
ULTRASOUND ABDOMEN COMPLETE

[Series 1: us abdomen complete · 0.20mm/px · 14 of 149 slices shown]
[im 1/149]
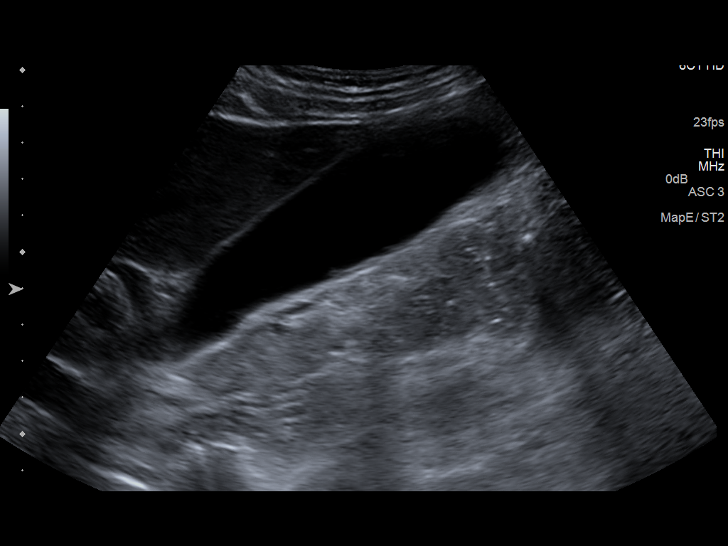
[im 13/149]
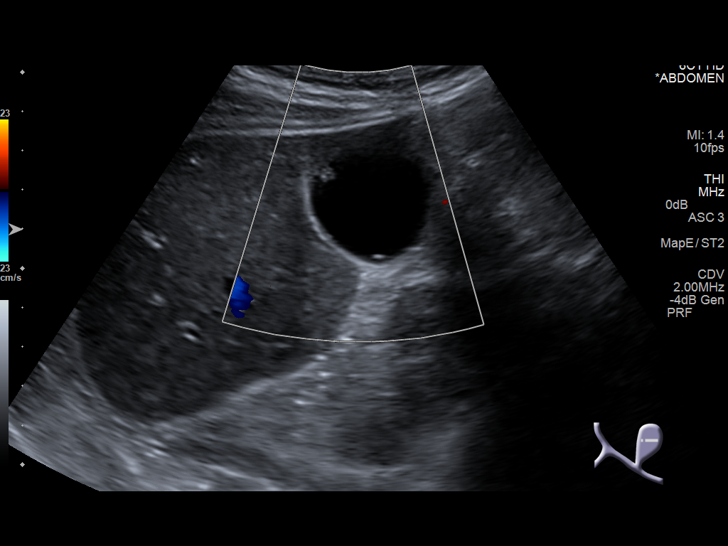
[im 25/149]
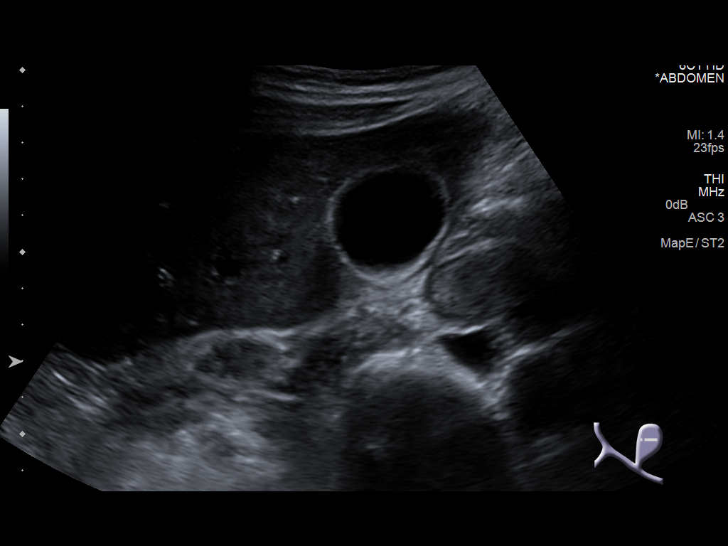
[im 38/149]
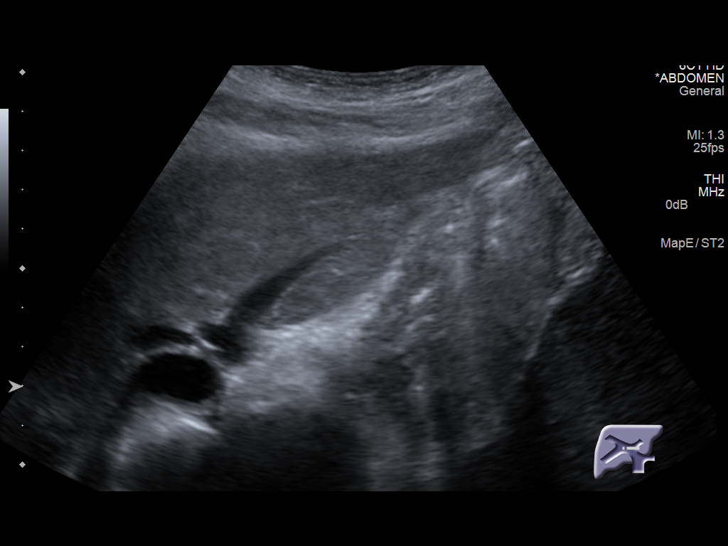
[im 50/149]
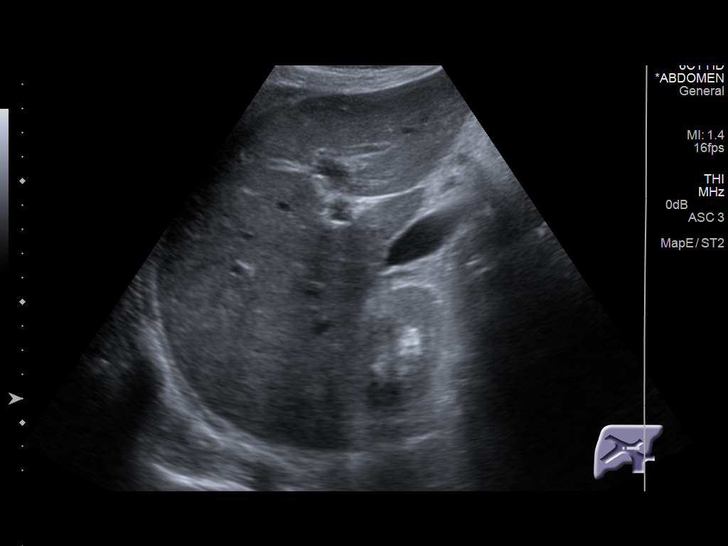
[im 56/149]
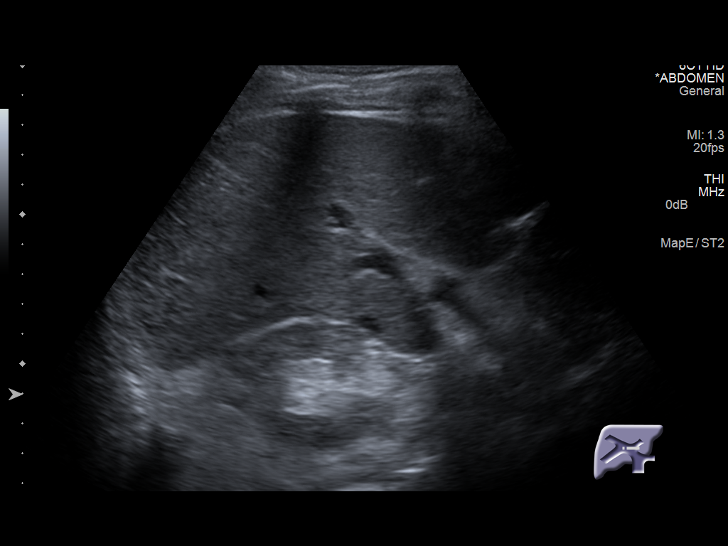
[im 68/149]
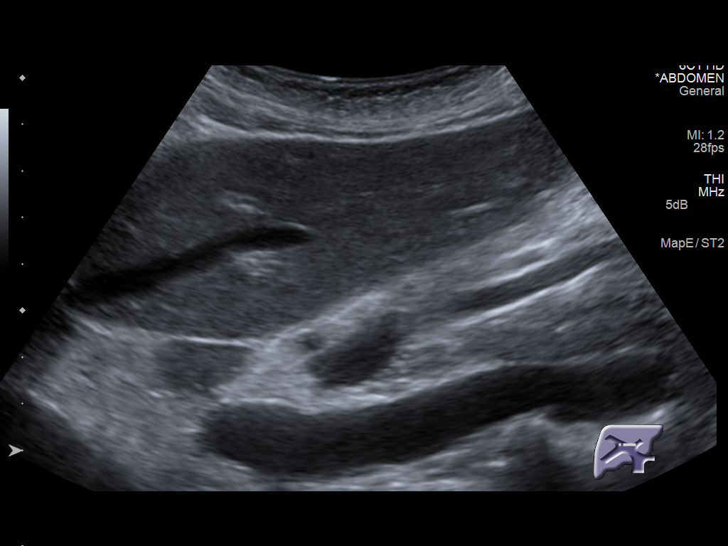
[im 81/149]
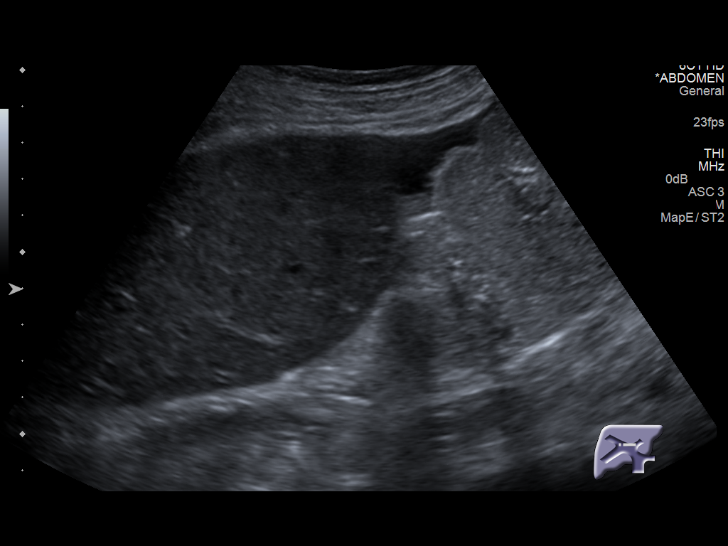
[im 93/149]
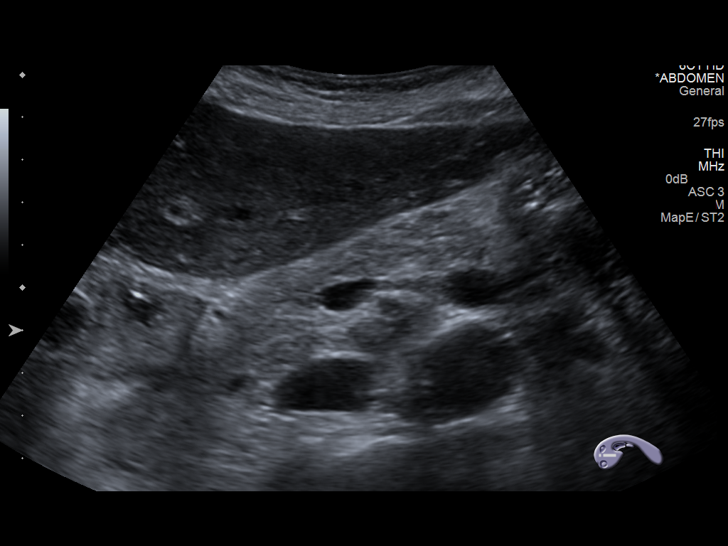
[im 99/149]
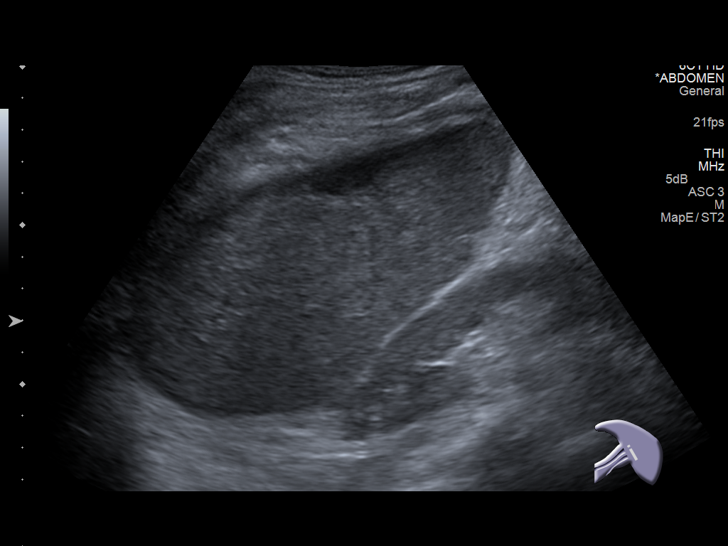
[im 112/149]
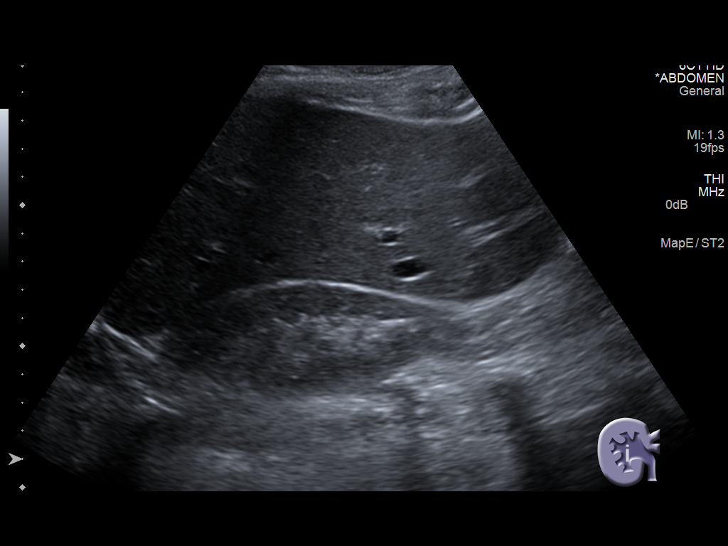
[im 124/149]
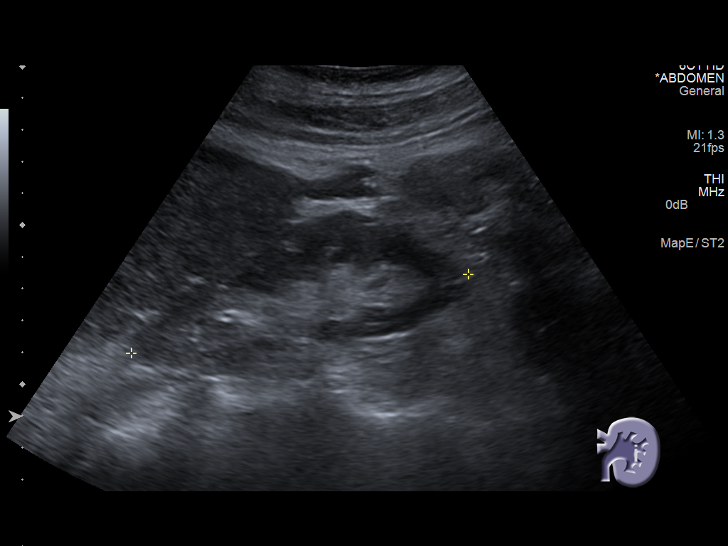
[im 136/149]
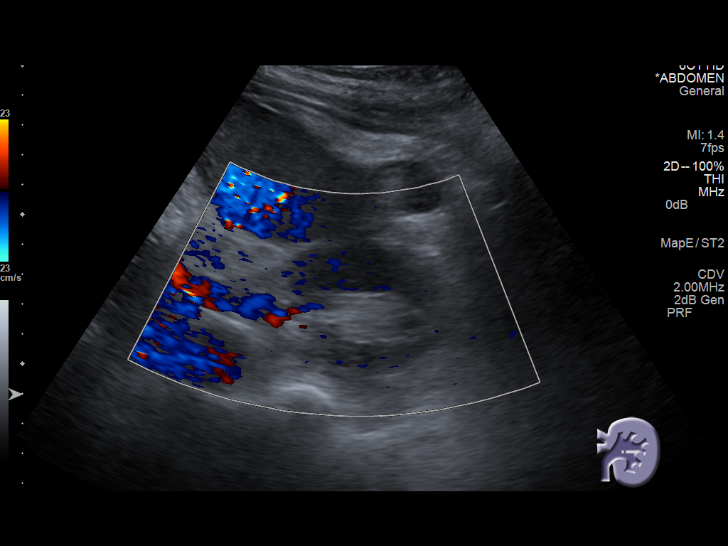
[im 149/149]
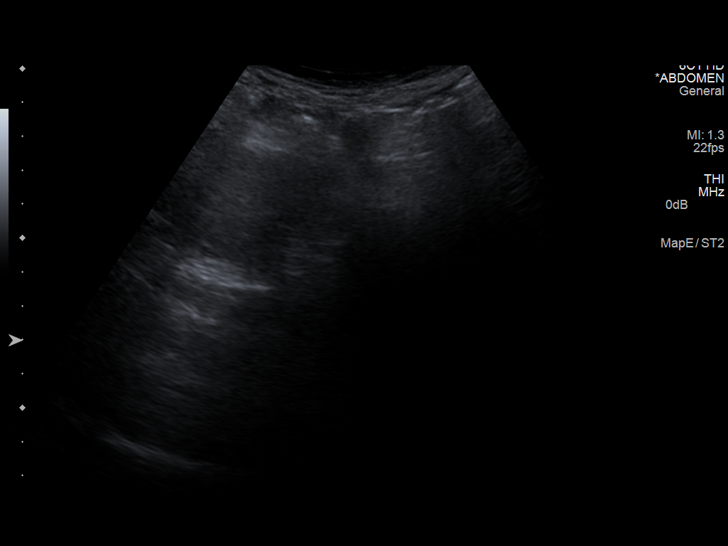

[14 of 25 positions shown; findings below may reference images not displayed]

FINDINGS: Gallbladder: Multiple tiny approximately 3 mm gallbladder polyps.
Gallbladder wall thickness normal at 1.1 mm. Negative Murphy sign.

Common bile duct: Diameter: 7.6 mm

Liver: No focal hepatic abnormality.  Hepatic echotexture normal.

IVC: No abnormality visualized.

Pancreas: Visualized portion unremarkable.

Spleen: Spleen is enlarged 15 cm with a volume of 857 cubic cm.

Right Kidney: Length: 11.9 cm. Echogenicity within normal limits. No
mass or hydronephrosis visualized. 12 mm nonobstructing lower pole
calyceal stone.

Left Kidney: Length: 10.9 cm. Echogenicity within normal limits. No
mass or hydronephrosis visualized.

Abdominal aorta: No aneurysm visualized.

Other findings: Mild ascites.
IMPRESSION: 1.  Splenomegaly.

2 mild ascites.

3.  Nonobstructing left nephrolithiasis.
